# Patient Record
Sex: Female | Born: 1975 | Race: Black or African American | Hispanic: No | Marital: Single | Smoking: Former smoker
Health system: Southern US, Community
[De-identification: ages and names within clinical notes are randomized; demographics above are authoritative.]

## PROBLEM LIST (undated history)

## (undated) DIAGNOSIS — N200 Calculus of kidney: Secondary | ICD-10-CM

## (undated) HISTORY — PX: KIDNEY STONE SURGERY: SHX686

## (undated) HISTORY — PX: TUBAL LIGATION: SHX77

---

## 1997-11-24 ENCOUNTER — Inpatient Hospital Stay (HOSPITAL_COMMUNITY): Admission: AD | Admit: 1997-11-24 | Discharge: 1997-11-24 | Payer: Self-pay | Admitting: *Deleted

## 1998-02-16 ENCOUNTER — Inpatient Hospital Stay (HOSPITAL_COMMUNITY): Admission: EM | Admit: 1998-02-16 | Discharge: 1998-02-16 | Payer: Self-pay | Admitting: Obstetrics

## 1998-05-11 ENCOUNTER — Inpatient Hospital Stay (HOSPITAL_COMMUNITY): Admission: AD | Admit: 1998-05-11 | Discharge: 1998-05-11 | Payer: Self-pay | Admitting: *Deleted

## 1998-08-03 ENCOUNTER — Inpatient Hospital Stay (HOSPITAL_COMMUNITY): Admission: AD | Admit: 1998-08-03 | Discharge: 1998-08-03 | Payer: Self-pay | Admitting: *Deleted

## 1998-10-26 ENCOUNTER — Inpatient Hospital Stay (HOSPITAL_COMMUNITY): Admission: AD | Admit: 1998-10-26 | Discharge: 1998-10-26 | Payer: Self-pay | Admitting: *Deleted

## 1999-01-18 ENCOUNTER — Inpatient Hospital Stay (HOSPITAL_COMMUNITY): Admission: AD | Admit: 1999-01-18 | Discharge: 1999-01-18 | Payer: Self-pay | Admitting: *Deleted

## 1999-04-26 ENCOUNTER — Inpatient Hospital Stay (HOSPITAL_COMMUNITY): Admission: AD | Admit: 1999-04-26 | Discharge: 1999-04-26 | Payer: Self-pay | Admitting: *Deleted

## 1999-07-19 ENCOUNTER — Inpatient Hospital Stay (HOSPITAL_COMMUNITY): Admission: AD | Admit: 1999-07-19 | Discharge: 1999-07-19 | Payer: Self-pay | Admitting: *Deleted

## 1999-10-11 ENCOUNTER — Inpatient Hospital Stay (HOSPITAL_COMMUNITY): Admission: AD | Admit: 1999-10-11 | Discharge: 1999-10-11 | Payer: Self-pay | Admitting: *Deleted

## 2000-01-03 ENCOUNTER — Inpatient Hospital Stay (HOSPITAL_COMMUNITY): Admission: EM | Admit: 2000-01-03 | Discharge: 2000-01-03 | Payer: Self-pay | Admitting: *Deleted

## 2000-04-15 ENCOUNTER — Emergency Department (HOSPITAL_COMMUNITY): Admission: EM | Admit: 2000-04-15 | Discharge: 2000-04-15 | Payer: Self-pay | Admitting: Emergency Medicine

## 2000-09-06 ENCOUNTER — Encounter: Payer: Self-pay | Admitting: Emergency Medicine

## 2000-09-06 ENCOUNTER — Emergency Department (HOSPITAL_COMMUNITY): Admission: EM | Admit: 2000-09-06 | Discharge: 2000-09-06 | Payer: Self-pay | Admitting: Emergency Medicine

## 2000-11-22 ENCOUNTER — Emergency Department (HOSPITAL_COMMUNITY): Admission: EM | Admit: 2000-11-22 | Discharge: 2000-11-23 | Payer: Self-pay | Admitting: *Deleted

## 2000-11-22 ENCOUNTER — Encounter: Payer: Self-pay | Admitting: Emergency Medicine

## 2001-06-22 ENCOUNTER — Other Ambulatory Visit: Admission: RE | Admit: 2001-06-22 | Discharge: 2001-06-22 | Payer: Self-pay | Admitting: Family Medicine

## 2002-02-02 ENCOUNTER — Emergency Department (HOSPITAL_COMMUNITY): Admission: EM | Admit: 2002-02-02 | Discharge: 2002-02-02 | Payer: Self-pay | Admitting: Emergency Medicine

## 2002-02-02 ENCOUNTER — Encounter: Payer: Self-pay | Admitting: Emergency Medicine

## 2002-03-01 ENCOUNTER — Other Ambulatory Visit: Admission: RE | Admit: 2002-03-01 | Discharge: 2002-03-01 | Payer: Self-pay | Admitting: Obstetrics and Gynecology

## 2002-04-25 ENCOUNTER — Encounter: Payer: Self-pay | Admitting: Obstetrics and Gynecology

## 2002-04-25 ENCOUNTER — Ambulatory Visit (HOSPITAL_COMMUNITY): Admission: RE | Admit: 2002-04-25 | Discharge: 2002-04-25 | Payer: Self-pay | Admitting: Obstetrics and Gynecology

## 2002-06-06 ENCOUNTER — Encounter: Payer: Self-pay | Admitting: Obstetrics and Gynecology

## 2002-06-06 ENCOUNTER — Ambulatory Visit (HOSPITAL_COMMUNITY): Admission: RE | Admit: 2002-06-06 | Discharge: 2002-06-06 | Payer: Self-pay | Admitting: Obstetrics and Gynecology

## 2002-09-10 ENCOUNTER — Encounter (INDEPENDENT_AMBULATORY_CARE_PROVIDER_SITE_OTHER): Payer: Self-pay | Admitting: Specialist

## 2002-09-10 ENCOUNTER — Inpatient Hospital Stay (HOSPITAL_COMMUNITY): Admission: AD | Admit: 2002-09-10 | Discharge: 2002-09-12 | Payer: Self-pay | Admitting: Obstetrics and Gynecology

## 2005-09-10 ENCOUNTER — Emergency Department (HOSPITAL_COMMUNITY): Admission: EM | Admit: 2005-09-10 | Discharge: 2005-09-10 | Payer: Self-pay | Admitting: Emergency Medicine

## 2006-07-08 ENCOUNTER — Emergency Department (HOSPITAL_COMMUNITY): Admission: EM | Admit: 2006-07-08 | Discharge: 2006-07-08 | Payer: Self-pay | Admitting: Emergency Medicine

## 2006-07-22 ENCOUNTER — Emergency Department (HOSPITAL_COMMUNITY): Admission: EM | Admit: 2006-07-22 | Discharge: 2006-07-22 | Payer: Self-pay | Admitting: Emergency Medicine

## 2007-01-04 ENCOUNTER — Emergency Department (HOSPITAL_COMMUNITY): Admission: EM | Admit: 2007-01-04 | Discharge: 2007-01-04 | Payer: Self-pay | Admitting: Emergency Medicine

## 2007-01-07 ENCOUNTER — Emergency Department (HOSPITAL_COMMUNITY): Admission: EM | Admit: 2007-01-07 | Discharge: 2007-01-08 | Payer: Self-pay | Admitting: Emergency Medicine

## 2007-01-11 ENCOUNTER — Emergency Department (HOSPITAL_COMMUNITY): Admission: EM | Admit: 2007-01-11 | Discharge: 2007-01-11 | Payer: Self-pay | Admitting: Emergency Medicine

## 2007-01-14 ENCOUNTER — Emergency Department (HOSPITAL_COMMUNITY): Admission: EM | Admit: 2007-01-14 | Discharge: 2007-01-15 | Payer: Self-pay | Admitting: Emergency Medicine

## 2007-01-15 ENCOUNTER — Inpatient Hospital Stay (HOSPITAL_COMMUNITY): Admission: AD | Admit: 2007-01-15 | Discharge: 2007-01-16 | Payer: Self-pay | Admitting: Obstetrics & Gynecology

## 2007-09-15 ENCOUNTER — Emergency Department (HOSPITAL_COMMUNITY): Admission: EM | Admit: 2007-09-15 | Discharge: 2007-09-15 | Payer: Self-pay | Admitting: Emergency Medicine

## 2007-10-12 ENCOUNTER — Emergency Department (HOSPITAL_COMMUNITY): Admission: EM | Admit: 2007-10-12 | Discharge: 2007-10-12 | Payer: Self-pay | Admitting: Emergency Medicine

## 2007-10-15 ENCOUNTER — Ambulatory Visit (HOSPITAL_COMMUNITY): Admission: RE | Admit: 2007-10-15 | Discharge: 2007-10-15 | Payer: Self-pay | Admitting: Emergency Medicine

## 2007-10-16 ENCOUNTER — Ambulatory Visit (HOSPITAL_COMMUNITY): Admission: RE | Admit: 2007-10-16 | Discharge: 2007-10-17 | Payer: Self-pay | Admitting: Gastroenterology

## 2007-10-16 ENCOUNTER — Ambulatory Visit: Payer: Self-pay | Admitting: Gastroenterology

## 2007-10-16 LAB — CONVERTED CEMR LAB
Albumin: 3.2 g/dL — ABNORMAL LOW (ref 3.5–5.2)
CO2: 23 meq/L (ref 19–32)
Creatinine, Ser: 0.5 mg/dL (ref 0.4–1.2)
GFR calc Af Amer: 186 mL/min
MCHC: 35.1 g/dL (ref 30.0–36.0)
MCV: 84.6 fL (ref 78.0–100.0)
Platelets: 358 10*3/uL (ref 150–400)
Potassium: 4.1 meq/L (ref 3.5–5.1)
RDW: 15.7 % — ABNORMAL HIGH (ref 11.5–14.6)
Sodium: 136 meq/L (ref 135–145)
Total Bilirubin: 11.4 mg/dL — ABNORMAL HIGH (ref 0.3–1.2)
Total Protein: 8.1 g/dL (ref 6.0–8.3)
WBC: 9 10*3/uL (ref 4.5–10.5)

## 2007-10-18 ENCOUNTER — Inpatient Hospital Stay (HOSPITAL_COMMUNITY): Admission: EM | Admit: 2007-10-18 | Discharge: 2007-10-21 | Payer: Self-pay | Admitting: Emergency Medicine

## 2007-10-24 ENCOUNTER — Ambulatory Visit: Payer: Self-pay | Admitting: Internal Medicine

## 2007-11-09 ENCOUNTER — Ambulatory Visit: Payer: Self-pay | Admitting: Gastroenterology

## 2007-11-09 LAB — CONVERTED CEMR LAB
Albumin: 3.2 g/dL — ABNORMAL LOW (ref 3.5–5.2)
Alkaline Phosphatase: 84 units/L (ref 39–117)
Basophils Absolute: 0 10*3/uL (ref 0.0–0.1)
Basophils Relative: 0 % (ref 0.0–1.0)
Eosinophils Absolute: 0.2 10*3/uL (ref 0.0–0.6)
HCT: 36.9 % (ref 36.0–46.0)
MCHC: 33.3 g/dL (ref 30.0–36.0)
MCV: 85.5 fL (ref 78.0–100.0)
Monocytes Absolute: 0.6 10*3/uL (ref 0.2–0.7)
Monocytes Relative: 7.2 % (ref 3.0–11.0)
Neutro Abs: 5.2 10*3/uL (ref 1.4–7.7)
Platelets: 319 10*3/uL (ref 150–400)
WBC: 7.9 10*3/uL (ref 4.5–10.5)

## 2007-11-22 ENCOUNTER — Ambulatory Visit (HOSPITAL_COMMUNITY): Admission: RE | Admit: 2007-11-22 | Discharge: 2007-11-22 | Payer: Self-pay | Admitting: Gastroenterology

## 2007-12-06 ENCOUNTER — Emergency Department (HOSPITAL_COMMUNITY): Admission: EM | Admit: 2007-12-06 | Discharge: 2007-12-07 | Payer: Self-pay | Admitting: Emergency Medicine

## 2008-04-27 ENCOUNTER — Inpatient Hospital Stay (HOSPITAL_COMMUNITY): Admission: EM | Admit: 2008-04-27 | Discharge: 2008-04-28 | Payer: Self-pay | Admitting: Emergency Medicine

## 2008-04-27 ENCOUNTER — Encounter: Payer: Self-pay | Admitting: Internal Medicine

## 2008-04-29 ENCOUNTER — Ambulatory Visit: Payer: Self-pay | Admitting: Internal Medicine

## 2008-04-30 ENCOUNTER — Emergency Department (HOSPITAL_COMMUNITY): Admission: EM | Admit: 2008-04-30 | Discharge: 2008-05-01 | Payer: Self-pay | Admitting: Emergency Medicine

## 2008-05-01 ENCOUNTER — Telehealth (INDEPENDENT_AMBULATORY_CARE_PROVIDER_SITE_OTHER): Payer: Self-pay | Admitting: *Deleted

## 2008-05-06 ENCOUNTER — Telehealth (INDEPENDENT_AMBULATORY_CARE_PROVIDER_SITE_OTHER): Payer: Self-pay | Admitting: *Deleted

## 2008-05-06 ENCOUNTER — Telehealth: Payer: Self-pay | Admitting: Gastroenterology

## 2008-05-07 ENCOUNTER — Emergency Department (HOSPITAL_COMMUNITY): Admission: EM | Admit: 2008-05-07 | Discharge: 2008-05-07 | Payer: Self-pay | Admitting: Emergency Medicine

## 2008-05-16 ENCOUNTER — Encounter: Payer: Self-pay | Admitting: Gastroenterology

## 2008-05-28 ENCOUNTER — Ambulatory Visit: Payer: Self-pay | Admitting: Gastroenterology

## 2008-05-28 DIAGNOSIS — K8050 Calculus of bile duct without cholangitis or cholecystitis without obstruction: Secondary | ICD-10-CM | POA: Insufficient documentation

## 2008-06-02 ENCOUNTER — Telehealth: Payer: Self-pay | Admitting: Gastroenterology

## 2008-06-17 ENCOUNTER — Encounter: Payer: Self-pay | Admitting: Gastroenterology

## 2008-10-01 ENCOUNTER — Emergency Department (HOSPITAL_COMMUNITY): Admission: EM | Admit: 2008-10-01 | Discharge: 2008-10-01 | Payer: Self-pay | Admitting: Emergency Medicine

## 2008-10-15 ENCOUNTER — Ambulatory Visit: Payer: Self-pay | Admitting: Gastroenterology

## 2008-10-16 ENCOUNTER — Encounter: Payer: Self-pay | Admitting: Gastroenterology

## 2008-10-16 LAB — CONVERTED CEMR LAB
ALT: 14 units/L (ref 0–35)
AST: 17 units/L (ref 0–37)
BUN: 9 mg/dL (ref 6–23)
Basophils Absolute: 0 10*3/uL (ref 0.0–0.1)
Basophils Relative: 0.5 % (ref 0.0–3.0)
Calcium: 9.1 mg/dL (ref 8.4–10.5)
Chloride: 105 meq/L (ref 96–112)
Creatinine, Ser: 0.7 mg/dL (ref 0.4–1.2)
Eosinophils Absolute: 0.3 10*3/uL (ref 0.0–0.7)
Eosinophils Relative: 3.1 % (ref 0.0–5.0)
GFR calc Af Amer: 126 mL/min
HCT: 36.7 % (ref 36.0–46.0)
Hemoglobin: 12.1 g/dL (ref 12.0–15.0)
MCHC: 32.9 g/dL (ref 30.0–36.0)
MCV: 85 fL (ref 78.0–100.0)
Neutro Abs: 5.5 10*3/uL (ref 1.4–7.7)
Neutrophils Relative %: 57 % (ref 43.0–77.0)
RBC: 4.31 M/uL (ref 3.87–5.11)
Total Bilirubin: 0.7 mg/dL (ref 0.3–1.2)
WBC: 9.5 10*3/uL (ref 4.5–10.5)

## 2008-11-04 ENCOUNTER — Telehealth: Payer: Self-pay | Admitting: Gastroenterology

## 2009-01-07 ENCOUNTER — Encounter: Payer: Self-pay | Admitting: Gastroenterology

## 2009-02-16 ENCOUNTER — Encounter (INDEPENDENT_AMBULATORY_CARE_PROVIDER_SITE_OTHER): Payer: Self-pay | Admitting: Surgery

## 2009-02-16 ENCOUNTER — Inpatient Hospital Stay (HOSPITAL_COMMUNITY): Admission: RE | Admit: 2009-02-16 | Discharge: 2009-02-20 | Payer: Self-pay | Admitting: Surgery

## 2009-02-24 ENCOUNTER — Encounter: Payer: Self-pay | Admitting: Gastroenterology

## 2009-02-25 ENCOUNTER — Ambulatory Visit: Payer: Self-pay | Admitting: Obstetrics and Gynecology

## 2009-02-25 ENCOUNTER — Inpatient Hospital Stay (HOSPITAL_COMMUNITY): Admission: AD | Admit: 2009-02-25 | Discharge: 2009-02-25 | Payer: Self-pay | Admitting: Obstetrics and Gynecology

## 2009-07-31 ENCOUNTER — Emergency Department (HOSPITAL_COMMUNITY): Admission: EM | Admit: 2009-07-31 | Discharge: 2009-07-31 | Payer: Self-pay | Admitting: Emergency Medicine

## 2009-09-11 ENCOUNTER — Emergency Department (HOSPITAL_COMMUNITY): Admission: EM | Admit: 2009-09-11 | Discharge: 2009-09-11 | Payer: Self-pay | Admitting: Emergency Medicine

## 2009-09-17 ENCOUNTER — Emergency Department (HOSPITAL_COMMUNITY): Admission: EM | Admit: 2009-09-17 | Discharge: 2009-09-17 | Payer: Self-pay | Admitting: Emergency Medicine

## 2009-12-31 IMAGING — RF DG CHOLANGIOGRAM OPERATIVE
1 series · 4 of 4 positions shown · non-contrast
Comparison: CT 10/01/2008

CLINICAL DATA: Cholelithiasis.

INTRAOPERATIVE CHOLANGIOGRAM
TECHNIQUE: Multiple fluoroscopic spot radiographs were obtained
during intraoperative cholangiogram and are submitted for
interpretation post-operatively.
Fluoroscopy Time: 12 seconds

[Series 1: run · 4 of 31 frames shown]
[frame 5/31]
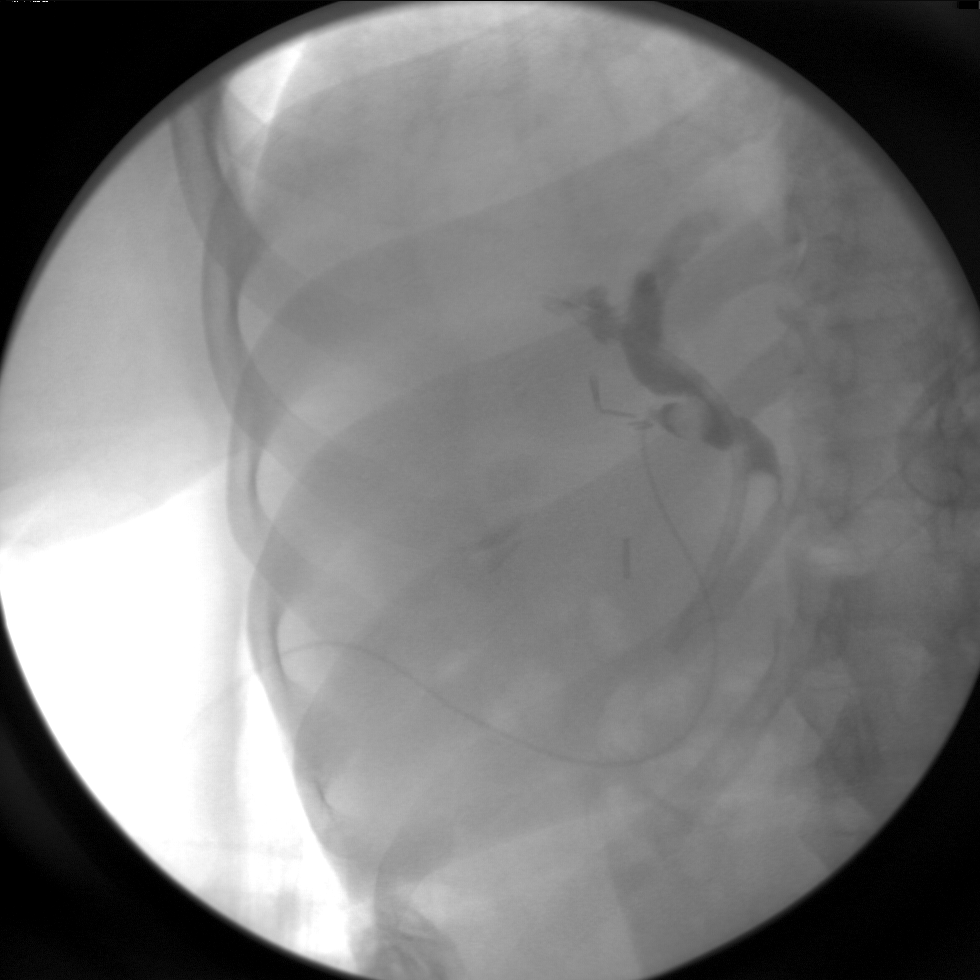
[frame 16/31]
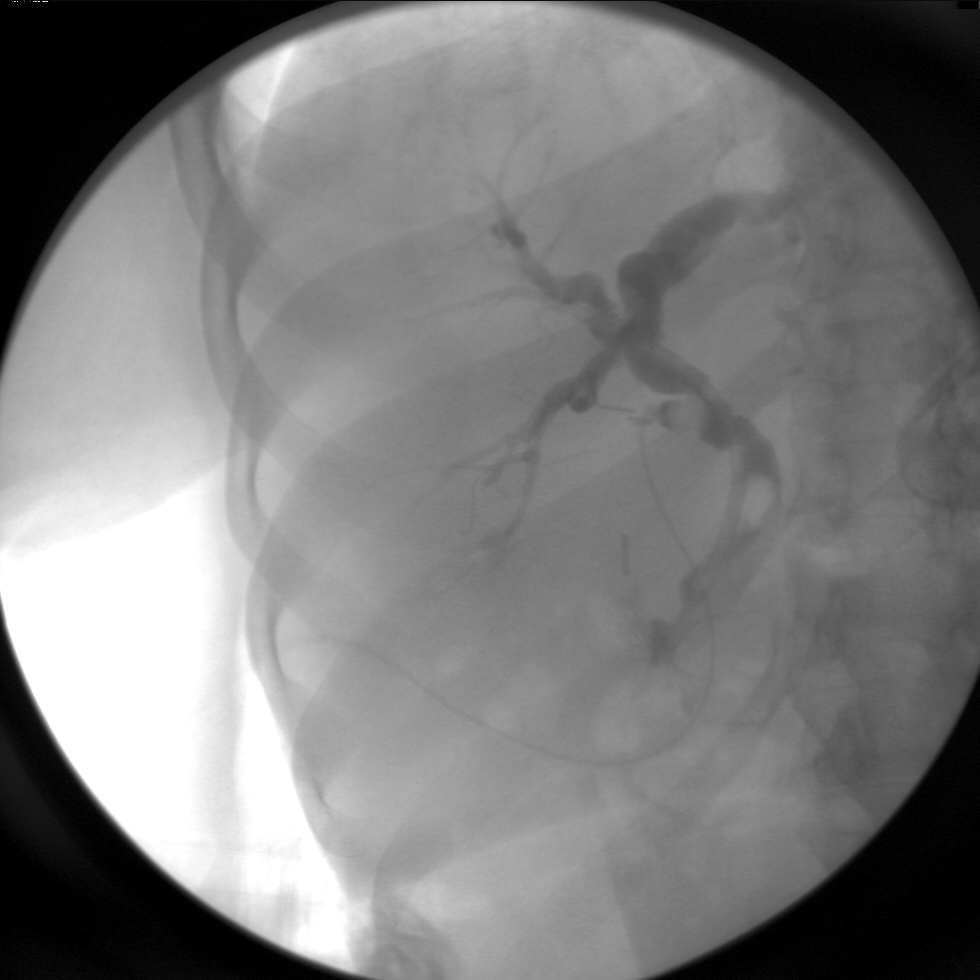
[frame 27/31]
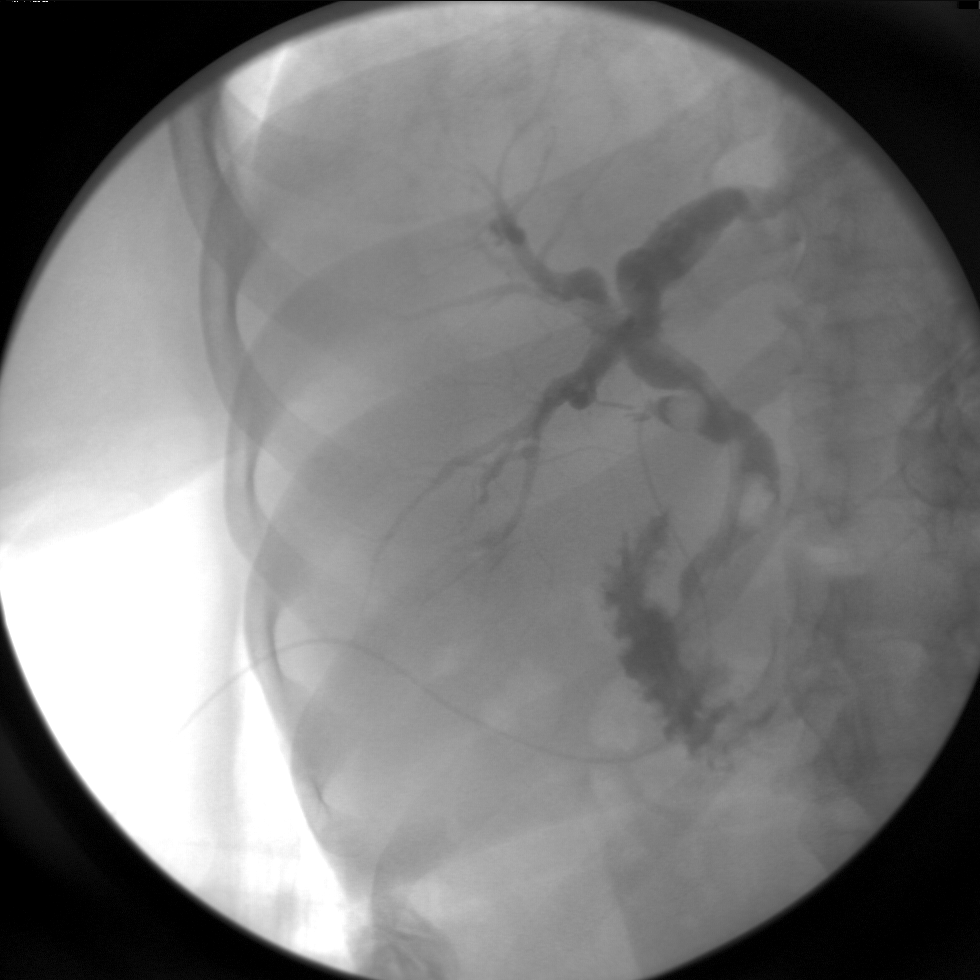
[frame 31/31]
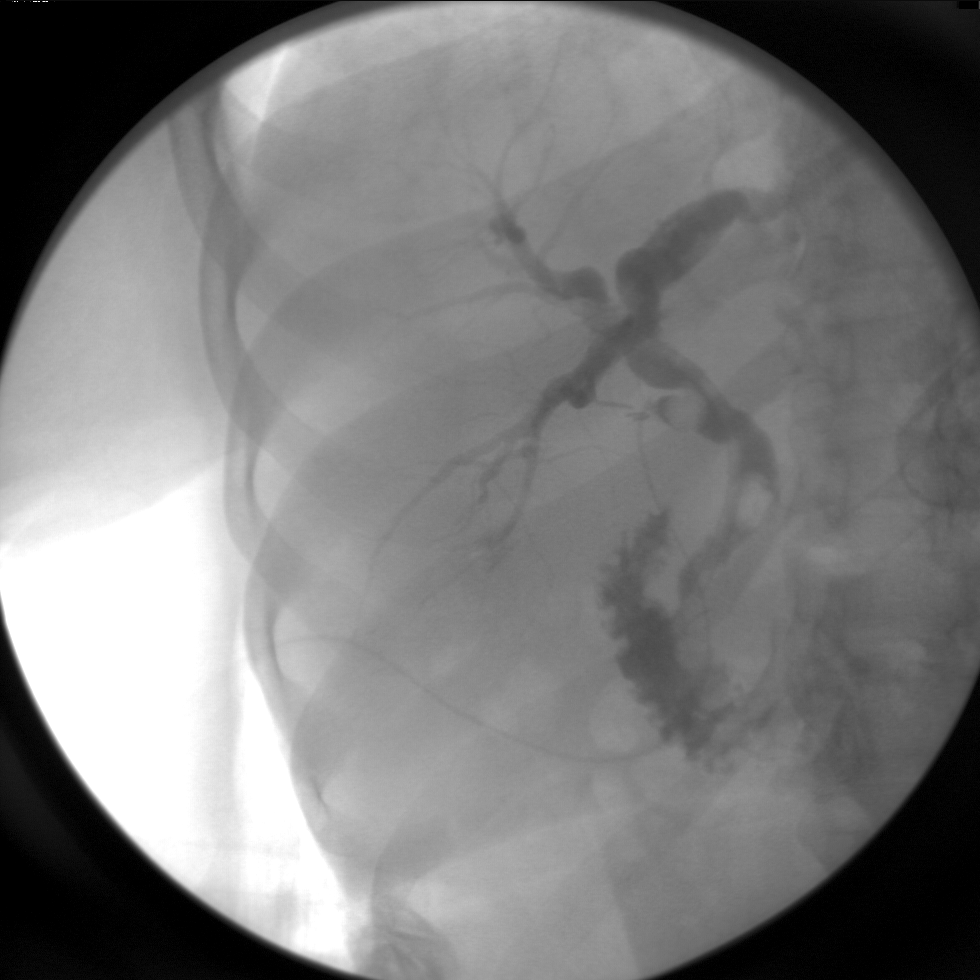

[4 of 4 positions shown; findings below may reference images not displayed]

FINDINGS: Contrast injection of the cystic duct.  There is a
filling defect in the cystic duct and also a filling defect in the
common bile duct.  Filling defects may be due to air bubbles
however calculi cannot be excluded.  Contrast passes into the
duodenum.  There is a biliary stent in position.  It extends from
the intrahepatic ducts down to the duodenum.
IMPRESSION: Intraluminal filling defects in the cystic duct and common bile
duct.  Ductal system is patent.  Biliary stent in satisfactory
position.

## 2011-01-03 LAB — POCT PREGNANCY, URINE: Preg Test, Ur: NEGATIVE

## 2011-01-03 LAB — URINE MICROSCOPIC-ADD ON

## 2011-01-03 LAB — URINALYSIS, ROUTINE W REFLEX MICROSCOPIC
Glucose, UA: NEGATIVE mg/dL
Protein, ur: NEGATIVE mg/dL
Specific Gravity, Urine: 1.024 (ref 1.005–1.030)
Urobilinogen, UA: 1 mg/dL (ref 0.0–1.0)

## 2011-01-11 LAB — CBC
HCT: 29.3 % — ABNORMAL LOW (ref 36.0–46.0)
Hemoglobin: 9.7 g/dL — ABNORMAL LOW (ref 12.0–15.0)
MCV: 86.8 fL (ref 78.0–100.0)
Platelets: 330 10*3/uL (ref 150–400)
RBC: 3.09 MIL/uL — ABNORMAL LOW (ref 3.87–5.11)
RBC: 3.36 MIL/uL — ABNORMAL LOW (ref 3.87–5.11)
WBC: 17 10*3/uL — ABNORMAL HIGH (ref 4.0–10.5)
WBC: 9.7 10*3/uL (ref 4.0–10.5)

## 2011-01-11 LAB — HEMOGLOBIN AND HEMATOCRIT, BLOOD
HCT: 36.3 % (ref 36.0–46.0)
Hemoglobin: 12.2 g/dL (ref 12.0–15.0)

## 2011-01-11 LAB — BILIRUBIN, TOTAL: Total Bilirubin: 0.7 mg/dL (ref 0.3–1.2)

## 2011-01-11 LAB — HEPATIC FUNCTION PANEL
ALT: 184 U/L — ABNORMAL HIGH (ref 0–35)
AST: 154 U/L — ABNORMAL HIGH (ref 0–37)
Indirect Bilirubin: 0.8 mg/dL (ref 0.3–0.9)
Total Protein: 4.9 g/dL — ABNORMAL LOW (ref 6.0–8.3)

## 2011-01-11 LAB — CREATININE, SERUM
Creatinine, Ser: 0.56 mg/dL (ref 0.4–1.2)
GFR calc Af Amer: >60 mL/min (ref >60)
GFR calc non Af Amer: >60 mL/min (ref >60)

## 2011-01-11 LAB — PREGNANCY, URINE: Preg Test, Ur: NEGATIVE

## 2011-01-11 LAB — WET PREP, GENITAL: Yeast Wet Prep HPF POC: NONE SEEN

## 2011-01-11 LAB — POTASSIUM: Potassium: 3.3 mEq/L — ABNORMAL LOW (ref 3.5–5.1)

## 2011-02-15 NOTE — Discharge Summary (Signed)
[name], [name]              ACCOUNT NO.:  [id number]   MEDICAL RECORD NO.:  [id number]          PATIENT TYPE:  INP   LOCATION:  1529                         FACILITY:  [facility]   PHYSICIAN:  Wilhemina Bonito. Marina Goodell, MD      DATE OF BIRTH:  [birth year]   DATE OF ADMISSION:  10/18/2007  DATE OF DISCHARGE:  10/21/2007                               DISCHARGE SUMMARY   ADMITTING DIAGNOSES:  68. A 35 year old female admitted with right upper quadrant pain.  Rule      out post ERCP pancreatitis versus biliary colic.  2. Rule out urinary tract infection.  3. Status post ERCP and sphincterotomy October 16, 2007 with finding      of a smooth stricture of common bile duct and choledocholithiasis      now status post biliary stenting.   DISCHARGE DIAGNOSES:  17. A 35 year old female stable and improved with choledocholithiasis      now status post stent replacement after migration.  2. Mild post ERCP pancreatitis, resolving.  3. Cholelithiasis.  4. 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   PROCEDURES:  ERCP and stent replacement per Dr. Yancey Flemings.   BRIEF HISTORY:  [name] is a 35 year old female seen by Dr. Christella Hartigan on  October 16, 2007 and undergoing ERCP that same day.  She was found to  have choledocholithiasis and a smooth common bile duct stricture making  removal of the common bile duct stones not feasible.  She did undergo a  sphincterotomy, and plastic biliary stent was placed.  She was watched  in the hospital overnight.  Her labs were repeated.  She had also  undergone CT scan of the abdomen and pelvis to rule out neoplasm, though  it was suspected that her stricture was benign.  The CT showed mild  residual biliary dilation following the stent placement.  Stent appeared  to be in good position.  There was new pneumobilia, no evidence of  pancreatic mass or pancreatic ductal dilation.  There was some mild  retroperitoneal edema inferior to the  pancreatic body and tail  suggesting mild pancreatitis.  She was having some nausea, but,  otherwise, felt better the following morning and was allowed discharge  to home.  On the evening of her discharge she developed increased right  upper quadrant pain which progressed and was constant.  She rated it  9/10.  At the time of readmission she has been nauseated, but has not  vomited.  Continues to have itching.  Did complain of some chills, but  no documented fever.  She came back to the emergency room and at this  time his admitted for pain control and further diagnostic evaluation.   LABORATORY DATA:  On January 15 shows WBC 9.9, hemoglobin 12.4,  hematocrit 36, MCV 83.8, platelets 418.  On January 17 WBC of 9.3,  hemoglobin 10.4, hematocrit 29.9, platelets 339.  Sodium 133, potassium  3.6, BUN 7, creatinine 0.64.  Albumin 2.9, total bilirubin on admission  was 11.8.  Alk phos 414, ALT 296, AST 179.  Follow-up  on January 18  showed a total bilirubin of 5.5, alk phos 311, ALT 195, and AST 75.  Lipase on admission was 81.  Follow-up on the 16th:  Lipase was 27.  UA  on admission showed 11-20 rbc's, 11-20 wbc's.  Urine culture was not  done.   X-RAY STUDIES:  Abdominal films on January 16 showed the biliary stent  which appeared to have flipped side to side.  It also showed the  proximal portion of the stent previously at T10 now at T12.   Abdominal ultrasound on January 16 showed suspect distal migration of  biliary stent beyond common bile duct stone with recurrent obstructive  choledocholithiasis, tiny amount of fluid around the gallbladder, distal  common bile duct obscured by overlying bowel gas.   HOSPITAL COURSE:  The patient was admitted to the service of Dr. Rob Bunting.  She was initially placed on sips of clear liquids, IV Dilaudid,  IV Zofran, IV Cipro, and baseline labs were obtained.  She did have some  evidence of mild pancreatitis.  The following morning she was  feeling  better without any significant pain.  Her bilirubin was 11.8, and  abdominal films were obtained to rule out stent migration.  The KUB was  abnormal, and showed that the stent had probably flipped and migrated  distally.  Dr. Marina Goodell then planned ERCP with stent change later that same  day.  This was accomplished on the 16th, and the old stent was pulled  and a new 8.32F/ 7 cm stent was replaced above the stricture.  She was  continued on antibiotics overnight.  Her bilirubin gradually decreased  thereafter.  She had no significant abdominal pain, remained afebrile,  and by the 18th was felt stable for discharge to home.  Plan was for  outpatient surgical consultation for cholecystectomy and to follow up  with Dr. Christella Hartigan on February 18 to schedule repeat procedure and stent  removal.   MEDICATIONS ON DISCHARGE:  1. Vicodin 5/500 one over six hours as needed for pain.  2. Cipro 500 b.i.d. x5 more days.  3. Phenergan 25 mg q.6 h. as needed for nausea.      Amy Esterwood, PA-C      John N. Marina Goodell, MD  Electronically Signed    AE/MEDQ  D:  11/05/2007  T:  11/05/2007  Job:  [id number]   cc:   Rachael Fee, MD  [address]  [place], [place] [address]

## 2011-02-15 NOTE — Assessment & Plan Note (Signed)
[facility]                         GASTROENTEROLOGY OFFICE NOTE   NAME:[name], [name]                       MRN:          [id number]  DATE:10/16/2007                            DOB:          [birth year]    REFERRING PHYSICIAN:  Devoria Albe, M.D.   The patient was referred by Devoria Albe, M.D. from the [facility] emergency room.   REASON FOR CONSULTATION:  Dr. Lynelle Doctor asked me to evaluate the patient in  consultation regarding jaundice.   HISTORY OF PRESENT ILLNESS:  The patient is a very pleasant 35 year old  woman who has had intermittent epigastric discomfort for several months.  About a week ago, she noticed yellowing of her eyes, darkening of her  urine, and itchy skin.  She presented to the emergency room again for  this complaint and found to have a total bilirubin of 9.2.  This was  four days ago.  Direct bilirubin 5.5, alkaline phosphatase 250, ALT 504,  AST 229.  A CBC was done that was normal.  Coags were also done and they  were normal.  She had an abdominal ultrasound performed yesterday  showing several gallstones in her gallbladder with a 5 mm gallstone in  her common bile duct with a common duct dilatation up to 10 mm.  She  also had some intrahepatic duct dilation.   She has had no fevers or chills, but she has been waking up sweaty the  past several days.  She has been nauseous especially in the afternoons.   REVIEW OF SYSTEMS:  Notable for stable weights, otherwise essentially  normal except for noted on nursing intake sheet.   PAST MEDICAL HISTORY:  None.   CURRENT MEDICATIONS:  None.   ALLERGIES:  No known drug allergies.   SOCIAL HISTORY:  Single.  She has two children.  Nonsmoker and  nondrinker.   FAMILY HISTORY:  Father with diabetes, otherwise noncontributory.   PHYSICAL EXAMINATION:  VITAL SIGNS:  5 feet 2 inches, 191 pounds, blood  pressure 120/76, pulse 60.  CONSTITUTIONAL:  Generally  well-appearing.  NEUROLOGY:  Alert and oriented x3.  HEENT:  Eyes; icteric sclerae.  Mouth; oropharynx moist with no lesions.  NECK:  Supple with no lymphadenopathy.  HEART:  Regular rate and rhythm.  LUNGS:  Clear to auscultation bilaterally.  ABDOMEN:  Soft, nontender, and nondistended with normal bowel sounds.  EXTREMITIES:  No lower extremity edema.  SKIN:  No rashes or lesions on visible extremities.   ASSESSMENT:  A 35 year old woman with jaundice, choledocholithiasis,  cholelithiasis.   I explained to the patient that she has a gallstone obstructing her  common bile duct.  This will need to be taken care of by ERCP.  She  understands there are risks to her procedure such as perforation,  pancreatitis, infection, bleeding.  She understands these risks and  wishes to proceed.  I will arrange for her to have an ERCP done later  this afternoon.  Hopefully it will be an uneventful case and she will be  able to go home afterward.  I would then arrange for her  to have an  outpatient evaluation to consider elective cholecystectomy.     Rachael Fee, MD  Electronically Signed    DPJ/MedQ  DD: 10/16/2007  DT: 10/16/2007  Job #: [id number]

## 2011-02-15 NOTE — H&P (Signed)
[name], [name]              ACCOUNT NO.:  [id number]   MEDICAL RECORD NO.:  [id number]          PATIENT TYPE:  INP   LOCATION:  1529                         FACILITY:  [facility]   PHYSICIAN:  Rachael Fee, MD   DATE OF BIRTH:  29-Aug-1976   DATE OF ADMISSION:  10/18/2007  DATE OF DISCHARGE:                              HISTORY & PHYSICAL   CHIEF COMPLAINT:  Pain in the right upper quadrant with nausea.   HISTORY OF PRESENT ILLNESS:  [name] is a 35 year old African  American female who has had at least a couple of months of epigastric  pain.  She was seen at the emergency room in December at which time labs  were normal.  She was treated for what was presumed to be reflux disease  with either a PPI or H2 blocker.  She did not have any imaging test  then.  January 9, she came back because of intermittent epigastric pain  which was not severe, and she also noticed that the whites of her eyes  were beginning to become yellow, her urine was getting dark, and her  skin was itchy.  Dr. Lynelle Doctor saw her in the emergency room that day, and  her total bilirubin was 9.2 with alkaline phosphatase 250, AST 229 and  ALT 504.  Urine pregnancy test negative.  Urinalysis negative.  CBC was  within normal limits.  Appetite is acute.  Profile was also negative.   She was referred to and saw Dr. Rob Bunting.   DICTATION ENDED HERE.      Jennye Moccasin, PA-C      Rachael Fee, MD     SG/MEDQ  D:  10/18/2007  T:  10/19/2007  Job:  [id number]

## 2011-02-15 NOTE — Op Note (Signed)
[name], [name]              ACCOUNT NO.:  [id number]   MEDICAL RECORD NO.:  [id number]          PATIENT TYPE:  OIB   LOCATION:  0098                         FACILITY:  [facility]   PHYSICIAN:  Ardeth Sportsman, MD     DATE OF BIRTH:  [birth year]   DATE OF PROCEDURE:  02/16/2009  DATE OF DISCHARGE:                               OPERATIVE REPORT   PRIMARY CARE PHYSICIAN:  Mirna Mires, M.D.   GASTROENTEROLOGIST:  Wendall Papa, M.D.  Danny Lawless, M.D. at [facility], Department  of Gastroenterology.   SURGEON:  Ardeth Sportsman, M.D.   ASSISTANT:  Almond Lint, MD.   PREOPERATIVE DIAGNOSES:  1. Chronic cholecystolithiasis, status post failed endoscopic      retrograde cholangiopancreatography x3.  2. Cholecystolithiasis.  3. Chronic cholecystitis  .   PROCEDURES PERFORMED:  1. Laparoscopic lysis of adhesions done x90 minutes.  2. Laparoscopic converted to open cholecystectomy.  3. Laparoscopic converted to open common bile duct exploration with      choledochoscopy and evacuation of chronic common bile duct stones.  4. Open hepaticojejunostomy with Roux-en-Y reconstruction.   ANESTHESIA:  1. General anesthesia.  2. Local aesthetic in a field block.   SPECIMENS:  1. Gallbladder.  2. Common bile duct stones.   DRAINS:  A 19-French Blake drain rests behind the right hepatic lobe and  then sweeps over the portal region over the hepaticojejunostomy and the  distal common bile duct/duodenal stump and common bile duct stump.   ESTIMATED BLOOD LOSS:  300 mL.   COMPLICATIONS:  Small mesentery tear with port placement initially, but  no evidence of any bowel injury.   INDICATIONS:  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  She had an  ERCP with some stones but incomplete removal. She had stent placement.  Dr. Christella Hartigan and Dr. Marina Goodell as well as Dr. Marilynne Drivers at [facility] have had to do repeat ERCP with stent changes.  Lithotripsies have been attempted to break them up, but they have been  unsuccessful.   The patient was sent to me for definitive removal of her common bile  duct stones through the biliary system.  The anatomy and physiology of  hepatobiliary and pancreatic function was discussed.  Pathophysiology of  choledocholithiasis and cholecystolithiasis with its risks for  cholangitis which she has already had and other risks were discussed.  Options were discussed and recommendations made for a laparoscopic  possible open cholecystectomy and common bile duct exploration with  removal of stones.  Risks, benefits, and alternatives were discussed.  Numerous questions were answered and she agreed to proceed.   OPERATIVE FINDINGS:  She had dense adhesions to the right upper quadrant  consistent with chronic cholecystitis with prior episodes of  cholangitis.  She had a very contracted gallbladder.  Her common bile  duct actually was closer to a narrow caliber.  She had 2 large common  bile duct stones, one at the cystic duct/common bile duct junction.   She had  another common bile duct stone in the distal aspect.  I was not  able to retrieve this by  choledochoscopy definitively with balloon or  basket.  The common bile duct was too shortened not to be able to spare  it, so we ended up having to do an open removal and hepaticojejunostomy.   DESCRIPTION OF PROCEDURE:  Informed consent was confirmed.  The patient  received IV cefoxitin and gentamicin given her ciprofloxacin allergy.  She had a Foley catheter sterilely placed.  She underwent anesthesia  without any difficulty.  She had sequential compression devices active  during the case.  She was positioned supine with both arms tucked.  Later in the case, her right arm was placed out.  Her abdomen was  prepped, and raped in a sterile fashion.   A surgical time-out confirmed our  plan.   A #5 mm port was placed in the right upper quadrant using optical entry  technique with the patient in steep Trendelenburg right side up.  Camera  inspection noted that I was getting close to but not within the  transverse colon.  Under direct visualization, a final port was placed  through the umbilicus.  On placing that, I did nick one of the mesentery  of the jejunum with  little bit of oozing of blood.  A 5-0 port was  placed in the right flank and a 5 converted to 10 mm port was placed in  the right anterior axillary line. Another  10 port was tunneled through  the falciform ligament and subxiphoid region.   Camera inspection noted a small nick in the mesentery of the jejunum.  Careful inspection noted that there was no more bleeding.  The bowel was  run around that region and re-inspected along the transverse colon and  meticulous inspection was done.  There was no evidence of any other  injury.   Attention was turned to the right upper quadrant.  Dense omental and  mesocolon adhesions could be seen on the right hepatic liver  edge.  These were carefully freed off using blunt as well as blunt dissection  hook cautery.  Over time, I was able to free enough of the epiploic  appendages and omentum to see a shrunken gallbladder fundus.  This was  grasped and elevated cephalad.  Further dissection was done to free the  mesorectal and mesocolon attachments to the anterior wall of the  gallbladder.  With that, the planes were very densely inflamed and thick  and cemented.  Using careful hook cautery and careful blunt as well as  occasional sharp dissection, I was able to free off the gallbladder from  its attachments to the liver bed in a dome down type fashion.  I felt  that was safe to do since the gallbladder was quite contracted.  I came  down to the infundibulum.  A good candidate for the posterior branch  cystic artery was controlled with one clip on the gallbladder side,  2  clips slightly proximal.  One of the clips fell off and later we were  able to locate that.  Further dissection was done and the hepatic artery  I could see rolling along right up against the gallbladder cystic duct  infundibulum and this was carefully seen, isolated and peeled away.  The  cystic duct was extremely short, only about 5 mm.   Further dissection was done to identify the portal triad in the anterior  common bile duct.  The common  bile duct was freed on its right lateral  wall all the way down to the duodenal edge.  Further dissection was done  to help free the left side.   A cholangiogram was attempted using a percutaneous placement of 5-French  cholangiocatheter through a puncture into the infundibulum, but I could  not advance it.  I ended up re-cutting at the cystic duct, advancing the  catheter and doing a cholangiogram.  A cholangiogram was run using  diluted radiopaque contrast & continuous fluoroscopy.  Two large filling  defects could be seen in the common bile duct.  The wound was at the  cystic duct/common bile duct junction.  Another one was distally more at  the distal common bile duct.  There was no evidence of any intrahepatic  stones.  This there was evidence of another centimeter intrahepatic  cystic duct but it was within the common bile duct itself.   Scissors were used to continue the partial cyst ductotomy  anterolaterally onto the common bile duct and opened up the remaining  cystic duct until I came to the common bile duct.  With that, we were  able to easily evacuate a stone at the cystic common bile duct juncture.  It probably about 9 mm in size and very spherical.   A multiple insert guide (MIG) was used through the 10 mL port in the  right subcostal region and a 4-French bougie balloon was passed through  it down into the distal common bile duct.  I could not get it to advance  very far.  I had to remove the [facility] plastic internal stent  to get  the choledochoscope to pass down more distally.  Numerous attempts were  tried to pull distal CBD stone(s) out.  We did feel like we pulled a  little bit of something back but not for certain.  We ended switching  over to the choledochoscope with irrigation.  The choledochoscope could  be seen advancing into the common bile duct and a few centimeters  distally I came across a very large common bile duct stone that was  densely adhered to the wall.  I tried to break it up with the scope.  I  tried to use a 4-French bougie balloon and a 5-French bougie balloon as  well as a wire basket.  Initially, at this spot, I was getting the stone  to move up a little bit, but upon re-inspection it was hard to tell.  I  extend the choledochotomy a little more distally but was coming close to  the duodenal edge since it was a short common bile duct inferior to the  common bile duct/cystic duct junction.  Further attempts were made and I  felt like I could get a little bit of it to move but not enough.   Re-inspection of the common bile duct noted the posterior wall was  intact, but the sidewalls of the ductotomy were looking rather ischemic.  I could not get the stone out and I did not want to extend the ductotomy  further laparoscopically.  So, we converted to an open incision.  A  right subcostal incision was carried from the subxiphoid port over to  the right upper quadrant port.  A Bookwalter was placed.  With exposure  of this, I was able to get completely around the common bile duct.  Looking on closer inspection, I felt like the common bile duct was not  healthy enough to close so we  decided to convert to a  hepaticojejunostomy.  We ended up transecting the common bile duct just  about 1 cm inferior to the cystic/common bile duct junction.  We opened  up the ductotomy on the anterior wall of the distal common bile duct.  I  was able to get some right angle forceps in there and actually  could  start feeling the tip of the stone.  The stone was very large.  I tried  to move it with my hand to milk it back up and could not.  I could see  it with the choledochoscope but not manipulate it.  Eventually, I was  able to get a longer right angle to actually grasp and break up the  stone.  Between milking my finger and grabbing with the right-angle  scope, I was able to milk out a lot of  large fragments distal common  bile duct stone.  I got most fragments out and with passing a 5-French  Foley balloon with about 4 passes, I eventually got several smaller 2-5  mm fragments brought up.  With that, we got the choledochoscope in the  distal common bile duct and could see some ulceration in the distal  common bile duct but not a major stricture evident as the  choledochoscope easily passed into the duodenum. There were a few  remaining stone fragments and we were able to flush out and sweep it  completely clean..   The choledochoscope was turned cephalad and I was able to cannulate the  proximal common bile duct, the proximal common hepatic duct and up into  the right and left intrahepatic chains which also looked pristine and  clear with no evidence of stricture or other abnormality, although on  the proximal right hepatic duct, there was a little bit ulceration,  probably from the proximal end of the prior ERCP plastic stent.  The  distal common bile duct stump was trimmed to healthier tissues and  oversewn using 3-0 Prolene stitch with tails obviously left externally  to good result.   We did a hepaticojejunostomy for biliary reconstruction.  We found the  ligament of Treitz.  We chose a site 30 cm distal to the limb of Treitz  and transected with a 55 GIA stapler and took the mesentery in an array-  like fashion.  With that, I was able to get good mobility of the distal  end.  A window was made in the transverse colon to the right of the  middle colic vessels and the distal  end of the proximal jejunal  transection was brought in retrocolic.  A loop easily laid on top of the  duodenal bulb right up to the proximal common hepatic duct. A side-to-  side jejuno-jejunostomy stapled transection was made by running the  small bowel about 30 cm distal to and have for the common channel. The  common defect was closed using 3-0 PDS in a running [place] fashion to  good result.  The common mesenteric defect between the jejunojejunostomy  was closed using interrupted 3-0 silk stitches.  The Peterson's defect  toward the jejunum went retrocolic through the transverse colon.  The  mesocolon was tacked to the jejunum going through its Peterson defect  around circumferentially using interrupted 3-0 silk figure-of-eight  stitches as well.   Hepaticojejunostomy was reconstructed after opening up the jejunum about  2 cm proximal to the transection staple line on the antimesenteric side.  Of note, it the jejunum was  laying such that the distal end swept off  laterally to have a candy-cane with the tail facing towards the right  flank.  The distal hepatic duct was trimmed back to healthy bleeding  tissue for good circumferential repair.  We did take a little patch of  the of the cystic duct lateral wall as well that was nice and viable.  A  hepaticojejunostomy was performed using interrupted 4-0 PDS stitches.  The back wall was sewn using interrupted stitches times 7 for core  stitches and 5 posterior wall stitches with the tails obviously  externally buried.  The anterior row was sewed with 6 more stitches  anteriorly to get a good hepaticojejunostomy.  The anastomosis looked  healthy and viable with no evidence of any bile leak.  A larger stitch  was placed from the jejunal end of the hepaticojejunostomy to the liver  for extra buttressing.   The [place] drain was placed as noted before.  Copious irrigation was  done.  Tisseel was placed circumferentially around the   hepaticojejunostomy incision as well as on top of the duodenal stump  repair.  A nasogastric tube had been placed in I confirmed good  placement of the stomach.  Copious irrigation was done in the abdomen  with clear return after about 4 liters irrigation.  The subcostal  incision was closed in a two-layer fashion using #1 looped PDS.  A #10 mm port in the right subcostal flank had rolled up above the costal edge  so I could not close that more aggressively.  The skin was closed using  4-0 Monocryl stitch.  A sterile dressing was applied.  The patient was  extubated and sent to the recovery room in stable condition.  Discussed  postop findings with the patient's family.  She had numerous calls  during our discussion and when we could talk to her daughter the  daughter was not available  right then. I re-explained it again and stressed the high points of it.  She expressed appreciation.  I had to discuss postoperative care and  postoperative recovery goals for discharge and long term follow-up as  well and she expressed appreciation.      Ardeth Sportsman, MD  Electronically Signed     SCG/MEDQ  D:  02/16/2009  T:  02/16/2009  Job:  [id number]   cc:   Annia Friendly. Loleta Chance, MD  Fax: [phone]   Rachael Fee, MD  [address]  [place], [place] [address]   Danny Lawless  Fax: [phone]

## 2011-02-15 NOTE — Op Note (Signed)
[name], [name]              ACCOUNT NO.:  [id number]   MEDICAL RECORD NO.:  [id number]          PATIENT TYPE:  INP   LOCATION:  1534                         FACILITY:  [facility]   PHYSICIAN:  Wilhemina Bonito. Marina Goodell, MD      DATE OF BIRTH:  1976/03/01   DATE OF PROCEDURE:  04/27/2008  DATE OF DISCHARGE:                               OPERATIVE REPORT   PROCEDURE:  Endoscopic retrograde cholangiography with biliary stent  exchange.   INDICATIONS:  Acute cholangitis.   HISTORY:  This is a 35 year old female who presented earlier this year  with abdominal pain and jaundice.  She was found to have common duct  stones, gallbladder stones, and a distal duct stricture.  Stones were  unable to be extracted on several occasions.  Thus, she has been  temporized with biliary stents.  Her last procedure was performed at the  [facility] [place].  The patient has been lost to follow-up since March.  She had presented to the emergency room in the early hours of this day  with abdominal pain, low-grade fever and jaundice.  She was found to  have elevated liver tests.  Biliary endoprosthesis noted on ultrasound.  She is now for ERCP with possible stent exchange and stone extraction.  The nature of the procedure as well as the risks, benefits and  alternatives were reviewed.  She understood and agreed to proceed.  The  patient was continued on IV Unasyn.   PHYSICAL EXAMINATION:  A somewhat sick-feeling female in no acute  distress.  She is alert and oriented.  VITAL SIGNS:  Stable.  LUNGS:  Clear.  HEART:  Regular.  ABDOMEN:  Soft with minimal tenderness the right upper quadrant.  Good  bowel sounds heard.   PROCEDURE:  After informed consent was obtained, the patient was sedated  with 60 mcg of fentanyl and 6 mg of Versed IV.  She was also given  Phenergan 25 mg IV.  Glucagon 0.5 mg was given as a duodenal relaxant.  Cetacaine spray was used to numb the oropharynx.  Subsequently the  Pentax  side-viewing scope was passed blindly into the esophagus.  The  proximal stomach was not examined.  The distal stomach and duodenal bulb  were normal.  The postbulbar duodenum was remarkable for a previously-  placed plastic biliary endoprosthesis protruding into the duodenum.  This was removed with an endoscopic snare.  A scout radiograph of the  abdomen with the initial stent in position was taken followed by a  radiograph with the stent removed.  Subsequently the common duct was  deeply and selectively cannulated through a prior small biliary  sphincterotomy.  No contrast was injected into the biliary system  initially intentionally.  The system was decompressed.  Only bile was  extracted.  No purulent material.  Subsequent injection of contrast into  the biliary tree revealed no significant dilation.  There was an 8-10-mm  distal filling defect.  This was consistent with stone.  The duct was  tapered narrowly below.  The cystic duct was patent.  No significant  attempts were made to extract  the stone.  A new 8.5-French in diameter,  7 cm in length, plastic biliary endoprosthesis was placed in good  position with the proximal portion of the stent below the bifurcation  but above the stone and the distal portion of the stent going just into  the duodenum at the level of the flap.  Excellent drainage noted and  accompanying radiographs obtained.   IMPRESSION:  1. 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  2. Persistent choledocholithiasis.  3. Status post biliary stent exchange.  4. Cholelithiasis.   RECOMMENDATIONS:  1. Continue antibiotics.  2. Discuss long-term treatment plan with Dr. Christella Hartigan and the GI team in      am at weekly meeting.  We may need additional information or input      from Dr. Marilynne Drivers, at [facility].      Wilhemina Bonito. Marina Goodell, MD  Electronically Signed     JNP/MEDQ  D:  04/27/2008  T:  04/27/2008  Job:  [id number]   cc:   Rachael Fee,  MD  [address]  [place], [place] [address]

## 2011-02-15 NOTE — Op Note (Signed)
[name], [name]              ACCOUNT NO.:  [id number]   MEDICAL RECORD NO.:  [id number]          PATIENT TYPE:  INP   LOCATION:  1529                         FACILITY:  [facility]   PHYSICIAN:  Wilhemina Bonito. Marina Goodell, MD      DATE OF BIRTH:  09-25-1976   DATE OF PROCEDURE:  10/19/2007  DATE OF DISCHARGE:                               OPERATIVE REPORT   PROCEDURE:  Endoscopic retrograde cholangiopancreatography with biliary  stent removal and replacement.   INDICATIONS:  Choledocholithiasis and biliary stricture.   HISTORY:  This is a 35 year old female with symptomatic cholelithiasis  and choledocholithiasis.  She initially underwent ERCP approximately  three days ago.  She was found to have an 8-mm common duct stone with  biliary ductal dilation.  The stone was above a benign-appearing, though  tight, distal stricture in the intrapancreatic portion of the common  bile duct.  The stone could not be extracted due to the restrictions  posed by the stricture.  Thus, a 7-French 5-cm biliary endoprosthesis  was placed.  The patient presented yesterday with recurrent abdominal  pain and persisting jaundice.  Her liver tests were worse from the day  previous.  Films of the abdomen today showed that the previously placed  biliary stent had migrated distally out of position, likely below the  level of the stricture.  Ultrasound today revealed minimal fluid around  the gallbladder, though  no gallbladder wall thickening.  The  cholelithiasis noted.  She is now for ERCP with stent exchange.  The  nature of the procedure as well as the risks, benefits, and alternatives  were reviewed with the patient in detail.  She understood and agreed to  proceed.   PHYSICAL EXAMINATION:  Well-appearing female in no acute distress.  She  is alert and oriented.  She is jaundiced.  Sclerae are icteric.  Abdomen  revealed some mild right upper quadrant tenderness.  LUNGS:  Were clear.  HEART:  Regular.  ABDOMEN:   Was otherwise benign.   PROCEDURE:  After informed consent was obtained ,the patient was sedated  with 150 mcg of fentanyl and 17 mg of Versed over the course of the  procedure.  Glucagon 0.5 mg IV was given as a duodenal relaxant.  Benadryl 50 mg IV was given as well to enhance sedation.  Ciprofloxacin  IV was continued preprocedurally.  The Pentax side-viewing endoscope was  then passed blindly into the esophagus.  The stomach was unremarkable.  The duodenal bulb was unremarkable.  Postbulbar duodenum was  unremarkable.  The major ampulla revealed the majority of the previously  placed biliary stent protruding into the duodenum.  This was retrieved  with an endoscopic snare.  Scout radiograph of the abdomen with the  endoscope in position revealed contrast from previous CT scan.  Additional injection of contrast yielded a small pancreatogram.  Subsequently, the common bile duct was deeply and selectively  cannulated.  Filling of contrast in the bile duct again revealed smooth  several centimeters in length distal stricture in the intrapancreatic  portion of the common bile duct.  Above this, the biliary  tree was  modestly dilated.  A 7- to 8-mm filling defect consistent with the  previously noted stone was seen in the region of bifurcation near the  left hepatic duct.  Initial attempts were made to pass a 10-French 7 cm  in length biliary endoprosthesis.  However, the system would not pass  through the scope due to overwhelming resistance raising a question as  to whether there was a defect in the lining of the inner portion of the  scope. Unfortunately, the backup scope was out for repair.  Thus, it was  elected to remove the 10 [place] system and use an 8.5-French in diameter  stent.  This passed without difficulty.  Thus, an 8.5-French 7 cm in  length plastic biliary endoprosthesis was placed into the bile duct.  Proximal portion of the stent was in good position, well above the   stricture but below the bifurcation.  The intraduodenal portion of the  stent was at the level of the distal flap.  Drainage was excellent.   IMPRESSION:  1. Migrated biliary stent status post removal.  2. Persistent distal bile duct stricture and proximal common duct      stone status post 8.5-French 7-cm stent placement.  3. Cholelithiasis.   RECOMMENDATIONS:  1. Continue antibiotics.  2. Observe postprocedurally both clinically and biochemically.  3. Long term management decisions for strictured duct and gallstones      to be determined.      Wilhemina Bonito. Marina Goodell, MD  Electronically Signed     JNP/MEDQ  D:  10/19/2007  T:  10/20/2007  Job:  [id number]   cc:   Rachael Fee, MD  [address]  [place], [place] [address]   [place] Daphine Deutscher, MD  [address]  [place]  [place] [address]

## 2011-02-15 NOTE — H&P (Signed)
NAMESTEPHAN, DRAUGHN              ACCOUNT NO.:  [id number]   MEDICAL RECORD NO.:  [id number]          PATIENT TYPE:  INP   LOCATION:  1534                         FACILITY:  [facility]   PHYSICIAN:  Wilhemina Bonito. Marina Goodell, MD      DATE OF BIRTH:  [birth year]   DATE OF ADMISSION:  04/27/2008  DATE OF DISCHARGE:                              HISTORY & PHYSICAL   PROBLEM:  Abdominal pain, fever, nausea and vomiting.   HISTORY:  Kalifa is a pleasant, generally healthy 35 year old African  American female with history of a distal common bile duct stricture.  She is known to Dr. Christella Hartigan from [facility].  She had undergone ERCP with  sphincterotomy in January 2009.  At that time with inability to remove a  distal common bile duct stone and therefore biliary stent was placed.  She came back to the hospital shortly thereafter with jaundice and  abdominal pain.  It was found that the initial stent had migrated.  This  was removed and a new stent was placed by Dr. Marina Goodell.  She then was  scheduled for EUS, which was performed per Dr. Christella Hartigan February 2009.  He  was found to have an 8 mm round stone abutting the common bile duct  stent, otherwise negative exam with no evidence for malignancy,  adenopathy, etc.  She had ERCP the same day with Dr. Christella Hartigan.  At ERCP  appeared that the stricture had resolved were multiple attempts made to  remove the stone, which were unsuccessful and a 10-cm 7-French stent was  placed.  She was subsequently referred to the [facility], Dr.  Fredric Mare, and underwent ERCP there.  At the time of admission, the patient  was unclear what exactly was done at the time of that ERCP.  However,  after reviewing her office records it shows that she had ERCP on March  31 which again was unsuccessful at stone removal.  She had several  attempts including a mechanical lithotripsy.  The stone was not able to  be extracted.  It was partially fragmented and then a dilator was placed  and a  7-French 9-cm straight biliary stent was placed into the bile  duct.  The plan at that time was for followup and 3 months with repeat  ERCP and lithotripsy.  She was also apparently placed on Urso 300 mg  twice daily.   At this time, the patient presents to the emergency room with complaints  of recurrent abdominal pain onset on the morning of admission associated  with chills.  She states the pain was intense, fairly constant and  associated with nausea, vomiting, fever at home.  She did not have any  radiation of her pain across her abdomen or into her back.  She was seen  and evaluated and admitted for supportive management with labs showing  elevated LFTs consistent with cholangitis.   CURRENT MEDICATIONS:  None regularly.   ALLERGIES:  No known drug allergies.   LABORATORY STUDIES:  On April 27, 2008, WBCs 12.1, hemoglobin 13.1,  hematocrit of 40.3, platelets 290, BUN 5, creatinine 0.66, total  bilirubin 3.5, alk phos 145, SGOT 1256, SGPT 1043, amylase 126, lipase  12, beta HCG was negative.  Abdominal ultrasound through the emergency  room shows cholelithiasis and a common bile duct of 8 mm containing a  common bile duct stent.  There was no definite ductal dilation noted.   FAMILY HISTORY:  Positive for diabetes on the paternal side of family,  negative for GI disease.   SOCIAL HISTORY:  The patient is single.  She currently lives with her  mother.  She does have 2 children.  She is currently unemployed.  No  tobacco and no EtOH.   REVIEW OF SYSTEMS:  GI:  As outlined above.  CONSTITUTIONAL:  Positive  for fever and chills today.  GU:  Urine has been darker this morning.  RESPIRATORY:  No cough, shortness of breath.  CARDIOVASCULAR:  Negative  for chest pain or anginal symptoms.  MUSCULOSKELETAL:  Negative.  NEURO/PSYCH:  Negative.  All other review of systems negative.   PHYSICAL EXAM:  Well-developed young African American female in no acute  distress.  Temperature is  100.5.  Blood pressure 126/85.  Pulse 102.  Sat 96 on  room air.  HEENT:  Pertinent for early scleral icterus.  NECK:  Supple.  There is no JVD.  CARDIOVASCULAR:  Regular rate and rhythm with S1 and S2.  No murmur, rub  or gallop.  PULMONARY:  Clear to A and P.  ABDOMEN:  Soft.  Bowel sounds are active.  She is mildly tender in the  epigastric region.  There is no guarding or rebound.  No palpable mass  or hepatosplenomegaly.  EXTREMITIES:  No clubbing, cyanosis or edema.  NEURO:  The patient is alert and oriented and exam is grossly nonfocal.   IMPRESSION:  6. A 35 year old [place] American female with history of the distal      common bile duct stricture felt to be inflammatory secondary to      choledocholithiasis now status post several endoscopic retrograde      cholangiopancreatographs with attempts at stone extraction and      currently with indwelling biliary stent who presents with acute      abdominal pain, nausea, fever and elevated liver function tests all      consistent with cholangitis likely secondary to stent occlusion.  2. Retained common bile duct stone.  3. Cholelithiasis.   PLAN:  The patient is admitted to the service Dr. Yancey Flemings for  intravenous fluid hydration, intravenous analgesics.  She will be  covered with intravenous antibiotics.  We will schedule for endoscopic  retrograde cholangiopancreatography with stent removal and exchange  today and to review her records from [facility] regarding other plans for a  management.      Amy Esterwood, PA-C      John N. Marina Goodell, MD  Electronically Signed    AE/MEDQ  D:  04/28/2008  T:  04/28/2008  Job:  [id number]   cc:   Christella Hartigan, Dr.   Fredric Mare, Dr.  [facility]

## 2011-02-15 NOTE — Discharge Summary (Signed)
Amanda [name], [name]              ACCOUNT NO.:  [id number]   MEDICAL RECORD NO.:  [id number]          PATIENT TYPE:  OIB   LOCATION:  1521                         FACILITY:  [facility]   PHYSICIAN:  Jennye Moccasin, PA-C    DATE OF BIRTH:  01/03/1976   DATE OF ADMISSION:  10/16/2007  DATE OF DISCHARGE:  10/17/2007                               DISCHARGE SUMMARY   ADMITTING DIAGNOSES:  1. Jaundice and abnormal liver chemistries.  2. Gallstones on ultrasound.  Gallstones in common bile duct and      dilated common bile duct seen on recent ultrasound.  3. Childbirth x2.   DISCHARGE DIAGNOSIS:  1. Choledocholithiasis.  ERCP performed on October 16, 2007.  Dr.      Christella [name] unable to pull the stone through a smooth 2-3 cm mid to      distal common bile duct stricture.  Therefore, biliary      sphincterotomy performed and a 7-French 5 cm plastic biliary stent      placed across the stent.  CT scan confirms the stricture to be      benign in appearance.  2. Jaundice improving within 24 hours of the procedure.   CONSULTATIONS:  Dr. Luretha [name] from general surgery.   PROCEDURE:  ERCP with sphincterotomy and placement of stent to the  common bile duct on October 16, 2007.   BRIEF HISTORY:  [name] is a pleasant 35 year old African-American  woman who has been having several weeks worth of intermittent epigastric  discomfort.  She had been seen twice in the emergency room regarding  these symptoms; the first time was on September 15, 2007 when her LFTs  and pancreatic enzymes were normal.  She did not have any imaging  studies at that time, as her labs were so benign.  She was treated as a  reflux case and given Pepcid and Phenergan for use.  She also was given  phone numbers to make contact and establish care with [facility].  On  October 11, 2006, the patient returned with recurrent pain.  This time,  her total bilirubin was 9.2 with direct portion at 5.5.  Alkaline  phosphatase was 250,  ALT 504 and AST 229.  A CBC was normal, coags were  normal.  An abdominal ultrasound showed several gallstones; a 5 mm  gallstone in her common bile duct and common bile duct dilated to about  10 mm.  There was also some associated intrahepatic duct dilatation.  She was referred and saw Dr. Rob [name] in the office on October 16, 2007.  That same day, he set her up for ERCP.   The patient underwent the ERCP uneventfully; however, because of a  benign-appearing biliary stricture.  He was unable to remove the stone,  and thus performed a sphincterotomy and placed a plastic stent as a  temporizing measure.  She was admitted overnight for observation, as  well as for CT scan and to obtain an initial general surgery  consultation for evaluation for laparoscopic cholecystectomy.   LABORATORY DATA:  Hemoglobin 10.5, hematocrit 30.7, platelets 349,000.  White blood  cell count 12.  Sodium 135, potassium 3.9.  Chloride 109,  CO2 of 22, glucose 100, BUN 7, creatinine 0.6.  Bilirubin went from 11.4  to 6.4.  Alkaline phosphatase went from 335 to 270.  AST went from 175  to 130.  ALT went from 342 to 264.  Albumin 2.5.  Calcium 8.1.  Prior  labs of relevance include an acute viral hepatitis panel, hepatitis B  surface antigen, hepatitis B core antibody IgM, hepatitis A antibody IgM  and hepatitis C antibody, all negative.   CT SCAN OF THE ABDOMEN:  The preliminary reports as follows:  Biliary  stent well positioned.  Mild biliary dilatation with pneumobilia.  No  pancreatic mass or pancreatic duct dilatation seen.  Probable mild  pancreatitis.   HOSPITAL COURSE:  The patient spent an uneventful overnight following  the ERCP.  She was treated initially with IV ciprofloxacin.  In the  morning this was changed to oral Cipro.  That morning, she went from  sips of clears to a low-fat diet which she tolerated.  Her only  complaint was that of per pruritus.  This had been an issue for the last   several days, if not of the last few weeks.  This was treated with  p.r.n. Benadryl.   The patient was seen by Dr. Daphine [name] who agreed with Dr. Christella [name]'  management with antibiotics and the biliary stent.  Dr. Daphine [name] had  planned an elective laparoscopic cholecystectomy after ruling out the  neoplastic process.  He did qualify that if she had __________  she may  need a partial cholecystectomy with removal of stones.   The patient was doing well following the CT scan and the surgery  consult.  She had not required any pain medication during the day and  was discharged to home on the afternoon of October 17, 2007.  She had a  follow-up appointment with Dr. Christella [name] on November 09, 2007 at 10:30 a.m.,  and she was told to go to the [facility] on either November 07, 2007  or November 08, 2007 for lab work which included a CBC and a hepatic  function profile.   MEDICATIONS AT DISCHARGE:  1. Ciprofloxacin 500 mg one p.o. b.i.d. for 7 days.  2. Benadryl 25 mg one to two p.o. q.6h. as needed for itching.  She      can get this over-the-counter.  3. Tylenol as needed for pain.   She was advised that if she developed severe pain, non-resolution of her  tea-colored urine, fevers or chills that she should call Dr. Christella [name] and  not wait for the appointment.  She is to follow a low-fat diet.      Jennye Moccasin, PA-C     SG/MEDQ  D:  10/17/2007  T:  10/17/2007  Job:  [id number]   cc:   Rachael Fee, MD  [address]  [place], [place] [address]   [place] Amanda Deutscher, MD  [address], [address]  [place]  [place] [address]

## 2011-02-15 NOTE — Assessment & Plan Note (Signed)
[facility]                         GASTROENTEROLOGY OFFICE NOTE   NAME:[name], [name]                       MRN:          [id number]  DATE:11/09/2007                            DOB:          1976-01-28    GI PROBLEM LIST:  1. Cholelithiasis.  2. Choledocholithiasis with distal common bile duct stricture of      unclear etiology.  Status post ERCP October 16, 2007 by me.  Smooth      distal 2-3 cm common bile duct stricture.  Biliary sphincterotomy      performed.  Stone could not be pulled through the stricture.  A 7.5-      French x 5 cm long plastic biliary stent was placed.  Stent did not      hold her for very long.  She represented with pain and jaundice and      so an 8.5-French x 7 cm long stent was placed by Dr. Yancey Flemings.      The smooth, distal common bile duct stricture was again seen.   INTERVAL HISTORY:  I last saw [name] about a month ago when she was  hospitalized.  Since her second ERCP and placement of a longer, larger  diameter stent she has felt very well.  She has had no abdominal pains.  She has had no fevers or chills. She had repeat labs done just prior to  this visit to check her liver tests and they are not back yet.   CURRENT MEDICATIONS:  None.   PHYSICAL EXAMINATION:  Weight 190 pounds.  Blood pressure 118/76.  Pulse  76.  CONSTITUTIONAL:  Generally well appearing abdomen.  Soft, nontender,  nondistended.  Normal bowel sounds.   ASSESSMENT AND PLAN:  A 35 year old woman with cholelithiasis,  choledocholithiasis, distal common bile duct stricture.   Currently she has an 8.5-French, 7 cm long plastic biliary stent in  place.  She had a CT scan while she was hospitalized suggesting no mass  in her pancreas.  I will arrange for her to EUS with ERCP in 2 weeks'  time.  My goal for the EUS is to evaluate the distal CBD stricture again  from a different vantage point to again make sure she has no pancreatic  masses.   Following that I will proceed with ERCP, removal of her  existing stent, and hopefully remove the common duct stone.  If she  still has a very tight distal stricture at that point, I would likely  replace the stent and would have to consider referral to tertiary  biliary center such as [facility] with Dr. Noland Fordyce.     Rachael Fee, MD  Electronically Signed    DPJ/MedQ  DD: 11/09/2007  DT: 11/09/2007  Job #: [id number]

## 2011-02-15 NOTE — Discharge Summary (Signed)
NAMESUMIE, REMSEN              ACCOUNT NO.:  [id number]   MEDICAL RECORD NO.:  [id number]          PATIENT TYPE:  INP   LOCATION:  1534                         FACILITY:  [facility]   PHYSICIAN:  Hedwig Morton. Juanda Chance, MD     DATE OF BIRTH:  December 23, 1975   DATE OF ADMISSION:  04/26/2008  DATE OF DISCHARGE:  04/28/2008                               DISCHARGE SUMMARY   ADMITTING DIAGNOSES:  59. A 35 year old female with history of distal common bile duct      stricture felt to be inflammatory secondary to choledocholithiasis      now status post several ERCP and attempt at stone extraction which      have been unsuccessful with persistent retained common bile duct      stone and indwelling biliary stents now presenting with acute      abdominal pain, nausea, fever and elevated liver function studies      consistent with cholangitis, felt secondary to stent occlusion.  2. Cholelithiasis.   DISCHARGE DIAGNOSES:  1. 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  2. Retained distal common bile duct stone, refractory to multiple      attempts at extraction thus far.  3. Cholelithiasis.   CONSULTATIONS:  None.   PROCEDURES:  ERCP by Dr. Yancey Flemings April 27, 2008.   BRIEF HISTORY:  [name] is a pleasant 35 year old African American  female generally in good health, known previously to Dr. Christella Hartigan, who was  found to have a distal common bile duct stricture in January 2009.  Was  also found to have a distal common bile duct stone.  She had undergone  ERCP and sphincterotomy with inability to remove the stone and then had  a stent placed.  Shortly thereafter she returned with jaundice and pain  and was found to have of cholangitis from stent migration.  This one was  removed and another one was placed per Dr. Marina Goodell.  She subsequently had  endoscopic ultrasound per Dr. Christella Hartigan which did reveal an 8 mm round  stone abutting the common bile duct  stent.  Otherwise negative exam.  ERCP that same day with finding of resolved distal common bile duct  stricture.  However, with inability to extract the retained stone  despite multiple attempts.  A 10 French 7-cm stent was then placed.  She  was referred to Dr. Marilynne Drivers at [facility] for repeat ERCP.  This  was done on December 31, 2007 again with inability to remove the distal  common bile duct stone despite multiple attempts including lithotripsy.  It was felt that the stent stone was partially fragmented but was unable  to be removed.  Once again a common bile duct stent 7-French 9 cm was  placed.  The patient was to be on Urso b.i.d. and scheduled for a repeat  ERCP in 3 months.  She, at this time, seems to be unaware of any plans  for follow up with Dr. Marilynne Drivers and presented to the emergency room with  complaints of recurrent abdominal pain, chills,  and low grade fever at  home on the morning of admission.  She complained of fairly intense  epigastric pain rated 10/10 associated with nausea and vomiting, no  radiation to the back or across the abdomen.  Liver function studies  were elevated and she was admitted with probable cholangitis.   HOSPITAL COURSE:  The patient was admitted to the service of Dr. Yancey Flemings who was covering the hospital.  She was placed on IV unison, given  analgesics and antiemetics as needed.  She was scheduled for ERCP on  July 26, which was done per Dr. Marina Goodell with finding of an occluded stent.  The bile was aspirated and was not purulent.  She did have a filling  defect in the common bile duct consistent with the stone, and a distal  stricture.  A new stent was placed 8.5 French 7-cm with excellent  drainage.  The patient had a very benign hospital course, by the  following day was feeling well, had had no recurrent fever.  Initial  labs had shown a WBC of 12.1, hemoglobin 13.4, total bilirubin 3.5, alk  phos 145, SGOT 09/1955, SGPT 1043, amylase was  126, lipase was 12.  Followup labs on July 27 with a WBC of 7.8, potassium 3.3, total  bilirubin 2.4, alk phos 115, SGOT 167, SGPT 498.  Blood cultures are  negative thus far.  She is allowed discharge to home today with a course  of Cipro 500 b.i.d. x 7 days.  We will give her K-Dur 20 one p.o. b.i.d.  times 2 days and also restart her on Urso 300 b.i.d.  We are arranging  for followup ERCP with Dr. Marilynne Drivers, hopefully within the next couple of  weeks for repeat attempt at lithotripsy.  If this is unsuccessful, she  may need to be referred for open cholecystectomy and common bile duct  exploration.  The patient has also made a followup appointment with Dr.  Christella Hartigan on August 26 at 9:30 a.m.  She is to call for any problems in the  interim.      Amy [place], PA-C      Amanda M. Juanda Chance, MD  Electronically Signed    AE/MEDQ  D:  04/28/2008  T:  04/28/2008  Job:  [id number]   cc:   Danny Lawless  Fax: [phone]

## 2011-02-15 NOTE — Discharge Summary (Signed)
[name], [name]              ACCOUNT NO.:  [id number]   MEDICAL RECORD NO.:  [id number]          PATIENT TYPE:  INP   LOCATION:  1524                         FACILITY:  [facility]   PHYSICIAN:  Ardeth Sportsman, MD     DATE OF BIRTH:  18-Dec-1975   DATE OF ADMISSION:  02/16/2009  DATE OF DISCHARGE:  02/20/2009                               DISCHARGE SUMMARY   PRIMARY CARE PHYSICIAN:  Dr. Mirna Mires.   GASTROENTEROLOGIST:  Dr. Wendall Papa, [facility].   OTHER GASTROENTEROLOGIST:  Danny Lawless over [facility], Gastroenterology.   SURGEON:  Karie Soda.   PRINCIPAL DIAGNOSES:  1. Chronic choledocholithiasis status post failed endoscopic      retrograde cholangiopancreatography and stenting in past, failed      completion endoscopic retrograde cholangiopancreatography with      intermittent cholangitis and stents.  2. Chronic cholecystitis with cholecystolithiasis.   PROCEDURES PERFORMED:  1. Laparoscopic lysis of adhesions, laparoscopic converted to open      cholecystectomy, laparoscopic converted to open common bile duct      exploration with choledoscopy and evacuation of chronic common bile      duct stones and removal of internal stent.  2. Open hepaticojejunostomy with Roux-en-Y reconstruction on Feb 16, 2009.   HOSPITAL COURSE:  [name] is a pleasant 35 year old female who had  presented with jaundice and common bile duct stones but had ERCP with  partial evacuation and required a stenting. Re-attempt was done and was  not successful.  She was sent to [facility]  where repeat ERCP including lithotripsy was done and was not successful  and a stent was placed.  She had had episodes of recurrent stent  occlusion needing exchange of the stent secondary to cholangitis.  Surgical consultation was made and plan was made for removal of the  stent and common bile duct stones and cholecystectomy through a  laparoscopic and possible open approach.   Patient underwent surgery on Feb 16, 2009.  We were able to get most of  the work done laparoscopically but the final stone could not be removed  by laparoscopic means and so I had to convert to open to complete the  case given her common bile duct was rather fibrotic and beaten up with  significant evidence of ischemia after dissection so we converted to  hepaticojejunostomy for a bile duct reconstruction.   Postoperatively, she had an NG tube placed and IV fluid placed and  external drain was placed.  Her NG output was low.  I removed the NG  tube and drain and started a diet.  By postop day #4, she was walking  the hallways, she was tolerating a solid diet without any difficulty,  she had a bowel movement and flatus.  Her pain was adequately controlled  by her oral pills.   Given her remarkable short-term recovery so far, I thought it would be  reasonable for a discharge home since she is really wanting to get home  to be with her family.  Therefore, the patient will be sent home with the following  recommendations:  1. She is to return to clinic to see me in about 7 to 10 days for      probable removal of the external biliary drain.  2. She should call if she any fevers, chills, sweats, nausea,      vomiting, poor control of pain, drainage from her incisions, or      other concerns.  3. She should use Tylenol over the counter p.r.n. pain, oxycodone 5 mg      one to three p.o. every 4 hours p.r.n. pain.  4. Aleve one to two p.o. every 12 hours p.r.n. pain.  5. She should walk at least an hour a day being active.  When she can      tolerate 30 minutes of walking without any difficulty, she may do      moderate activity and then gradually advance unrestricted activity      over the next month or so.  6. She should keep her incisions clean and dry and shower every day      and use dressings p.r.n. only.   This plan was discussed in the  presence of a nurse, Darreld Mclean, as well as in  the presence of her family.  All are in agreement with this plan.      Ardeth Sportsman, MD  Electronically Signed     SCG/MEDQ  D:  02/20/2009  T:  02/20/2009  Job:  [id number]   cc:   Rachael Fee, MD  [address]  [place], [place] [address]   Annia Friendly. Loleta Chance, MD  Fax: [phone]   Danny Lawless  Fax: [phone]

## 2011-02-15 NOTE — H&P (Signed)
[name], [name]              ACCOUNT NO.:  [id number]   MEDICAL RECORD NO.:  [id number]          PATIENT TYPE:  INP   LOCATION:  1529                         FACILITY:  [facility]   PHYSICIAN:  Rachael Fee, MD   DATE OF BIRTH:  1975/12/25   DATE OF ADMISSION:  10/18/2007  DATE OF DISCHARGE:                              HISTORY & PHYSICAL   ADDENDUM:  This is a continuation of the admission history.   The patient went to see Dr. Christella Hartigan in the office on January 13, and he  did an ERCP that afternoon.  The ERCP study did confirm that she had a  common bile duct stone; however, she had a smooth stricture and he was  unable to remove this.  She did receive a sphincterotomy and had a  plastic stent placed.  She was kept in the hospital overnight.  Her labs  were rechecked and by the 13th her total bilirubin was 11.4, alkaline  phosphatase 335, AST 175 and ALT 342.  She was treated with Cipro, IV  fluids, initially just sips of clear liquids, but by the morning her  diet was advanced to a low-fat diet.  She had a CT scan of the abdomen.  This study showed mild residual biliary dilatation following biliary  stent placement.  Stent looked to be in good position and there was new  pneumobilia in the left lobe, which is to be expected following  sphincterotomy.  No evidence of pancreatic mass or pancreatic ductal  dilatation.  There was some mild retroperitoneal edema predominantly  inferior to the pancreatic body and tail, suggesting mild pancreatitis.  The patient did not have her lipase checked that day but she was doing  well.  Her pain, which was not significant on the day of the ERCP, was  not present the day following.  She was still complaining of the  pruritus and this was treated with Benadryl.  Her IV Cipro was changed  to oral Cipro.  She tolerated the low-fat diet and was sent home in  stable and improved condition.  She had an appointment to follow up in  early February with  Dr. Christella Hartigan.   On the evening of discharge, which was last night, she started to  develop right upper quadrant pain.  It persisted and is worse today.  She scores it at a 9/10 at its most intense.  She has nausea but has not  thrown up.  She still has the itching.  Her urine is less dark.  She  says she has had little bit of chills but she is not febrile.  She has  returned to the ER and some labs are still pending but the CBC is  normal.  The CMET and the lipase are still pending.  Urinalysis shows  some white cells and some red cells but is not strongly suggestive of an  active UTI.   ALLERGIES:  None.   MEDICATIONS:  Cipro 500 mg 1 twice daily.   SOCIAL HISTORY:  The patient is single.  She lives in [place].  She  has two  children.  She does not smoke or drink alcohol.   FAMILY HISTORY:  Significant for diabetes mellitus adult-onset type in  her father.   REVIEW OF SYSTEMS:  Less pruritus.  Urine is less dark.  No dysuria.  No  syncope.  No headaches.  No visual changes.  No chest pain, no cough.  Otherwise the review of systems is negative.   PAST MEDICAL HISTORY:  1. Choledocholithiasis.  This was associated with jaundice and she      underwent ERCP on January 13 with sphincterotomy and biliary stent      placement.  A common bile duct stone was not extractable secondary      to smooth common bile duct stricture.  2. Pancreatitis by CT scan on January 14.  3. Status post childbirth.   PHYSICAL EXAMINATION:  GENERAL:  The patient looks less icteric than she  did yesterday.  VITAL SIGNS:  Blood pressure is 140/88, temperature 98.4, room air  saturation 100%, respirations 20, pulse is 84.  HEENT:  Less icteric.  Extraocular movements are intact.  Conjunctiva is  pink.  Oropharynx is clear and moist.  NECK:  There are no masses, no JVD.  PULMONARY:  Clear to auscultation and percussion bilaterally.  CARDIOVASCULAR:  Regular rate and rhythm.  No murmurs, rubs or gallops.   GASTROINTESTINAL:  Abdomen is soft, nontender.  No guarding, no rebound.  The tenderness is predominantly in the right upper quadrant.  There is  minimal tenderness if any in the left upper quadrant.  No masses, no  hepatosplenomegaly.  NEUROLOGIC:  She is alert and oriented x3.  No tremors.  She moves all  four limbs easily and sits up in the bed easily.  PSYCHIATRIC:  She is appropriate.   LABORATORY:  Still pending at this time are the CMET and the lipase.  Hemoglobin is 12.4, hematocrit is 36, MCV 83.8.  Platelet count is 418.  White blood cell count 9.9.  Urinalysis is significant for a small  amount of leukocyte esterase and no nitrites, large amount of blood,  moderate bilirubin, cloudy appearance.  Urine microscopic shows a few  squamous epithelial cells and a few bacterial cells, 11-20 white blood  cells and 11-20 red blood cells.   IMPRESSION:  1. Right upper quadrant pain, rule out ongoing biliary colic, rule out      post ERCP pancreatitis versus biliary pancreatitis.  2. Rule out urinary tract infection.  Her urine does show moderately      significant amounts of white blood cells and red blood cells on      microscopic analysis.  3. Two days ago underwent ERCP with sphincterotomy.  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  A plastic biliary stent was      placed.  She has been seen by Dr. Adriana Mccallum for surgical      consideration but he plans surgery some time after she follows up      with Dr. Christella Hartigan in early February.   PLAN:  Admit the patient for supportive care, including IV fluids,  p.r.n. pain and nausea meds.  We still are awaiting the pending labs.  Apparently there is some problem with the lab equipment.  We will plan  to recheck a lipase and CMET tomorrow.      Jennye Moccasin, PA-C  Rachael Fee, MD  Electronically Signed    SG/MEDQ  D:  10/18/2007   T:  10/18/2007  Job:  [id number]

## 2011-02-18 NOTE — Discharge Summary (Signed)
[name], [name]                          ACCOUNT NO.:  [id number]   MEDICAL RECORD NO.:  [id number]                   PATIENT TYPE:  INP   LOCATION:  9106                                 FACILITY:  [facility]   PHYSICIAN:  Crist Fat. Rivard, M.D.              DATE OF BIRTH:  [birth year]   DATE OF ADMISSION:  09/10/2002  DATE OF DISCHARGE:  09/12/2002                                 DISCHARGE SUMMARY   ADMISSION DIAGNOSES:  1. Intrauterine pregnancy at 37 and 6/7 weeks.  2. Early labor.  3. Pregnancy induced hypertension without preeclampsia.  4. Multiparity desiring bilateral tubal ligation for sterilization.   DISCHARGE DIAGNOSES:  1. Intrauterine pregnancy at 37 and 6/7 weeks.  2. Early labor.  3. Pregnancy induced hypertension without preeclampsia.  4. Multiparity desiring bilateral tubal ligation for sterilization.  5. Status post normal spontaneous vaginal delivery.  6. Status post postpartum bilateral tubal ligation.  7. Bottle feeding.   PROCEDURE:  1. Normal spontaneous vaginal delivery of a viable female infant named Anthony     Wayne [place], Montez Hageman. who had Apgars of 8 and 9 and weighed 6 pounds 15     ounces.  Attended in delivery by Dr. Osborn Coho.  2. Postpartum bilateral tubal ligation for sterilization also on September 10, 2002 by Dr. Osborn Coho.  Please see operative note for details.   HOSPITAL COURSE:  The patient is a 35 year old black female gravida 3, para  1-0-1-1 at 18 and 6/7 weeks who presented complaining of uterine  contractions and was found to be in early labor.  She was admitted and  progressed in labor to normal spontaneous vaginal delivery of a viable female  infant named Anthony Wayne [place], Montez Hageman. who weighed 6 pounds 15 ounces and  had Apgars of 8 and 9 attended by Dr. Osborn Coho.  She also desired  bilateral tubal ligation and underwent the same on September 10, 2002 attended  by Dr. Osborn Coho also.  Postoperatively she has done well.   She is  ambulating, voiding, and eating without difficulty.  She is bottle feeding  her baby also without difficulty.  Her vital signs have been stable.  She  has been afebrile.  She has required Procardia to decrease her pregnancy  induced hypertension and her diastolic blood pressures are now in the 80s  and 90s.  She is ambulating, voiding, and eating without difficulty and is  deemed ready for discharge at this time.   DISCHARGE INSTRUCTIONS:  As per the [facility] handout.   DISCHARGE MEDICATIONS:  1. Motrin 600 mg p.o. q.6h. p.r.n. for pain.  2. Tylox one to two p.o. q.4-6h. p.r.n. for pain.  3. Prenatal vitamins daily.  4. Procardia XL 30 mg p.o. q.d. per Dr. Dois Davenport Rivard.   DISCHARGE LABORATORIES:  Her hemoglobin is 11.8, WBC count of 15.3, and  platelets 259,000.  DISCHARGE FOLLOWUP:  In one week by the home health nurse, three weeks at  [facility] for a blood pressure check, and six weeks for her  routine postpartum visit or p.r.n.     Concha Pyo. Duplantis, C.N.M.              Crist Fat Rivard, M.D.    SJD/MEDQ  D:  09/12/2002  T:  09/12/2002  Job:  [id number]

## 2011-02-18 NOTE — Op Note (Signed)
[name], [name]                          ACCOUNT NO.:  [id number]   MEDICAL RECORD NO.:  [id number]                   PATIENT TYPE:  INP   LOCATION:  9106                                 FACILITY:  [facility]   PHYSICIAN:  Osborn Coho, M.D.                DATE OF BIRTH:  1976-07-30   DATE OF PROCEDURE:  09/10/2002  DATE OF DISCHARGE:                                 OPERATIVE REPORT   PREOPERATIVE DIAGNOSES:  1. Elective permanent sterilization.  2. Postpartum day number 0 status post normal spontaneous vaginal delivery.   POSTOPERATIVE DIAGNOSES:  1. Elective permanent sterilization.  2. Postpartum day number 0 status post normal spontaneous vaginal delivery.   PROCEDURE:  Postpartum bilateral tubal ligation.   COMPLICATIONS:  None.   FLUIDS:  1500 cc.   ESTIMATED BLOOD LOSS:  Minimal, less than 5 cc.   ANESTHESIA:  Epidural.   FINDINGS:  Bilateral fallopian tubes with fimbriated ends.   PATHOLOGY:  Bilateral portions of fallopian tubes.   PROCEDURE:  The patient was taken to the operating room after the risks,  benefits, and alternatives discussed with patient.  The patient verbalized  understanding and consent.  Form reaffirmed as being signed and witnessed.  The patient was taken to the operating room and a surgical level on the  epidural was obtained per anesthesia.  The patient was in dorsal supine  position and an approximately 1 cm incision was made just below the  umbilicus.  The incision was carried down to the underlying layer of fascia  and the peritoneum identified and entered with the Metzenbaum scissors.  The  left fallopian tube was identified and carried out to its fimbriated end.  The fallopian tube was grasped in the mid portion with a Babcock and two 0  plain free ties used to ligate the tube.  Approximately 2 cm of the tube was  excised and sent off to pathology.  The remaining portion of the tube was  cauterized for hemostasis.  There was good  hemostasis and the fallopian tube  was returned to the intra-abdominal cavity.  Attention was then turned to  the right fallopian tube which in a similar manner was identified and  carried out to its fimbriated end and grasped in its mid portion with the  Babcock.  Two 0 plain free ties were used to ligate the tube and the tube  was excised for a length of approximately 3 cm and sent off to pathology.  The remaining portion of the tube was cauterized for hemostasis.  There was  good hemostasis and the tube was returned to the intra-abdominal cavity.  The fascia was  identified and repaired with 0 Vicryl in a running fashion.  3-0 Monocryl  subcuticular stitch was performed.  Steri-Strips were applied to the  incision.  The patient tolerated procedure well.  Sponge, lap, and needle  count was correct.  The patient  was returned to the recovery room in stable  condition.                                               Osborn Coho, M.D.    AR/MEDQ  D:  09/10/2002  T:  09/10/2002  Job:  [id number]

## 2011-02-18 NOTE — H&P (Signed)
[name], [name]                          ACCOUNT NO.:  [id number]   MEDICAL RECORD NO.:  [id number]                   PATIENT TYPE:  MAT   LOCATION:  MATC                                 FACILITY:  [facility]   PHYSICIAN:  Hal Morales, M.D.             DATE OF BIRTH:  [birth year]   DATE OF ADMISSION:  09/10/2002  DATE OF DISCHARGE:                                HISTORY & PHYSICAL   HISTORY OF PRESENT ILLNESS:  This is a 35 year old gravida 3, para 1, 0-1-1,  at 37-6/7 weeks who presents by ambulance with complaint of onset of  contractions one hour ago.  She denies leaking or bleeding.  She reports  that the pregnancy has been followed by the M.D. service and remarkable for  (1) history of fibroid, (2) history of preterm labor with delivery at 36  weeks, (3) history of PIH, (4) history of hyperemesis, and (5) group B strep  negative.   OB HISTORY:  Remarkable for abortion in 1995 and a vaginal delivery in 1998  of a female infant at [redacted] weeks gestation, weighing 4 pounds, complicated by  pregnancy-induced hypertension and preterm labor.   MEDICAL HISTORY:  Remarkable for history of hyperemesis, hypertension,  premature labor, and intraoperative varicella.   FAMILY HISTORY:  Remarkable for sister with hypertension, the father with  diabetes, the mother with migraines.   GENETIC HISTORY:  Unremarkable.   SOCIAL HISTORY:  The patient is single but involved with Anthony [place], who  is supportive.  She is of the [employer].  She denies any alcohol,  tobacco, or drug use.   OBJECTIVE DATA:  VITAL SIGNS:  Stable.  Blood pressure is ranging 151-178/90-  110.  HEENT: Within normal limits.  NECK:  Thyroid normal and not enlarged.  CHEST:  Clear to auscultation bilaterally.  HEART:  Heart rate regular rate and rhythm.  ABDOMEN:  Gravid at 38 cm, vertex to Leopold's.  EFM shows a fetal heart  rate of 145, excels to 160. Uterine contractions every three minutes.  PELVIC:   Cervical exam initially was 1 cm.  After 45 minutes, it was 2-3 cm,  90% effaced, and -2 station with a vertex presentation and bulging  membranes.  Vertex was confirmed by bedside ultrasound.  Leaking was noted  and was nitrazine positive and fern negative.  However, ferning test was  done after lubricant was used.  EXTREMITIES:  1+ edema on lower extremities only.   LABORATORY DATA:  Cathed U/A shows negative protein and specific gravity of  less than 1.005.  PIH labs were drawn.  Platelet count 313.  All other PIH  labs within normal limits.  Uric acid 3.9.   ASSESSMENT:  1. Intrauterine pregnancy at 37-6/7 weeks.  2. Latent phase of labor.  3. Hypertension without evidence of preeclampsia.  4. Group B strep negative.    PLAN:  1. Consulted Dr. Dierdre Forth.  Admit  to a birthing suite.  2. Routine M.D. orders.  3. Epidural.  4. Further orders to follow.     Marie L. Williams, C.N.M.                 Hal Morales, M.D.    MLW/MEDQ  D:  09/10/2002  T:  09/10/2002  Job:  [id number]

## 2011-06-22 LAB — DIFFERENTIAL
Basophils Relative: 0
Monocytes Relative: 5

## 2011-06-22 LAB — COMPREHENSIVE METABOLIC PANEL
ALT: 264 — ABNORMAL HIGH
AST: 130 — ABNORMAL HIGH
CO2: 22
Calcium: 8.1 — ABNORMAL LOW
Chloride: 109
Creatinine, Ser: 0.61
GFR calc Af Amer: >60
GFR calc non Af Amer: >60
Glucose, Bld: 100 — ABNORMAL HIGH
Sodium: 135
Total Bilirubin: 6.4 — ABNORMAL HIGH

## 2011-06-22 LAB — HEPATITIS PANEL, ACUTE
HCV Ab: NEGATIVE
Hepatitis B Surface Ag: NEGATIVE

## 2011-06-22 LAB — CBC
HCT: 30.7 — ABNORMAL LOW
HCT: 36
Hemoglobin: 10.5 — ABNORMAL LOW
Hemoglobin: 12.2
MCHC: 34.1
MCV: 83.8
Platelets: 349
Platelets: 397
RDW: 15.6 — ABNORMAL HIGH
RDW: 16.2 — ABNORMAL HIGH

## 2011-06-22 LAB — BASIC METABOLIC PANEL
BUN: 6
Chloride: 107
Glucose, Bld: 70
Potassium: 3.7

## 2011-06-22 LAB — HEPATIC FUNCTION PANEL
AST: 229 — ABNORMAL HIGH
Alkaline Phosphatase: 250 — ABNORMAL HIGH
Bilirubin, Direct: 5.5 — ABNORMAL HIGH
Total Bilirubin: 9.2 — ABNORMAL HIGH

## 2011-06-22 LAB — URINALYSIS, ROUTINE W REFLEX MICROSCOPIC
Nitrite: NEGATIVE
Protein, ur: NEGATIVE
Urobilinogen, UA: 0.2

## 2011-06-22 LAB — URINE MICROSCOPIC-ADD ON

## 2011-06-22 LAB — PROTIME-INR: INR: 1

## 2011-06-23 LAB — DIFFERENTIAL
Lymphocytes Relative: 10 — ABNORMAL LOW
Neutrophils Relative %: 82 — ABNORMAL HIGH

## 2011-06-23 LAB — COMPREHENSIVE METABOLIC PANEL
ALT: 260 — ABNORMAL HIGH
AST: 179 — ABNORMAL HIGH
AST: 195 — ABNORMAL HIGH
BUN: 1 — ABNORMAL LOW
BUN: 7
CO2: 26
CO2: 28
Calcium: 8.3 — ABNORMAL LOW
Calcium: 8.4
Chloride: 98
Creatinine, Ser: 0.64
GFR calc Af Amer: >60
GFR calc non Af Amer: >60
GFR calc non Af Amer: >60
Glucose, Bld: 111 — ABNORMAL HIGH
Glucose, Bld: 90
Potassium: 3.6
Sodium: 135
Total Bilirubin: 11.8 — ABNORMAL HIGH
Total Protein: 6.3

## 2011-06-23 LAB — CBC
HCT: 29.9 — ABNORMAL LOW
HCT: 36
Hemoglobin: 10.4 — ABNORMAL LOW
Hemoglobin: 12.4
MCHC: 34.4
MCHC: 34.7
MCV: 83.8
RBC: 4.29
RDW: 16.5 — ABNORMAL HIGH
WBC: 9.9

## 2011-06-23 LAB — URINALYSIS, ROUTINE W REFLEX MICROSCOPIC
Ketones, ur: NEGATIVE
Nitrite: NEGATIVE
Urobilinogen, UA: 1

## 2011-06-23 LAB — HEPATIC FUNCTION PANEL
ALT: 195 — ABNORMAL HIGH
AST: 75 — ABNORMAL HIGH
Albumin: 2.4 — ABNORMAL LOW
Total Protein: 6.1

## 2011-06-23 LAB — LIPASE, BLOOD: Lipase: 25

## 2011-06-27 LAB — COMPREHENSIVE METABOLIC PANEL
ALT: 11
AST: 13
Alkaline Phosphatase: 57
Glucose, Bld: 113 — ABNORMAL HIGH
Potassium: 3.9
Sodium: 137
Total Protein: 7

## 2011-06-27 LAB — DIFFERENTIAL
Basophils Relative: 0
Eosinophils Absolute: 0.2
Eosinophils Relative: 1
Neutro Abs: 14.6 — ABNORMAL HIGH

## 2011-06-27 LAB — CBC
Hemoglobin: 12
RBC: 4.24
RDW: 13.8

## 2011-06-27 LAB — URINALYSIS, ROUTINE W REFLEX MICROSCOPIC
Glucose, UA: NEGATIVE
Specific Gravity, Urine: 1.018
Urobilinogen, UA: 1

## 2011-06-27 LAB — URINE MICROSCOPIC-ADD ON

## 2011-07-01 LAB — CULTURE, BLOOD (ROUTINE X 2): Culture: NO GROWTH

## 2011-07-01 LAB — CBC
HCT: 34.1 — ABNORMAL LOW
Hemoglobin: 11.2 — ABNORMAL LOW
MCHC: 33.3
MCHC: 33.3
MCV: 83.5
MCV: 85.2
Platelets: 290
Platelets: 372
RBC: 4.81
RDW: 14
RDW: 14.6
WBC: 7.4

## 2011-07-01 LAB — DIFFERENTIAL
Basophils Absolute: 0
Basophils Absolute: 0
Basophils Relative: 0
Basophils Relative: 0
Basophils Relative: 3 — ABNORMAL HIGH
Eosinophils Absolute: 0
Eosinophils Absolute: 0.2
Eosinophils Relative: 0
Lymphocytes Relative: 2 — ABNORMAL LOW
Lymphocytes Relative: 8 — ABNORMAL LOW
Lymphs Abs: 0.2 — ABNORMAL LOW
Monocytes Absolute: 0.5
Monocytes Relative: 4
Monocytes Relative: 8
Neutro Abs: 11.4 — ABNORMAL HIGH
Neutro Abs: 3.7
Neutro Abs: 6.2
Neutrophils Relative %: 49
Neutrophils Relative %: 81 — ABNORMAL HIGH
Neutrophils Relative %: 94 — ABNORMAL HIGH

## 2011-07-01 LAB — URINE CULTURE
Colony Count: NO GROWTH
Culture: NO GROWTH

## 2011-07-01 LAB — COMPREHENSIVE METABOLIC PANEL
ALT: 1043 — ABNORMAL HIGH
ALT: 209 — ABNORMAL HIGH
AST: 36
Albumin: 3.3 — ABNORMAL LOW
Albumin: 3.6
Alkaline Phosphatase: 115
Alkaline Phosphatase: 122 — ABNORMAL HIGH
Alkaline Phosphatase: 145 — ABNORMAL HIGH
BUN: 2 — ABNORMAL LOW
BUN: 5 — ABNORMAL LOW
Calcium: 9.4
Chloride: 104
Creatinine, Ser: 0.48
Glucose, Bld: 106 — ABNORMAL HIGH
Potassium: 3.3 — ABNORMAL LOW
Potassium: 3.5
Potassium: 4.3
Sodium: 140
Sodium: 141
Total Bilirubin: 2.4 — ABNORMAL HIGH
Total Protein: 6.3
Total Protein: 7.3
Total Protein: 7.8

## 2011-07-01 LAB — URINALYSIS, ROUTINE W REFLEX MICROSCOPIC
Glucose, UA: NEGATIVE
Glucose, UA: NEGATIVE
Leukocytes, UA: NEGATIVE
Nitrite: NEGATIVE
Specific Gravity, Urine: 1.021
Specific Gravity, Urine: 1.036 — ABNORMAL HIGH
pH: 5.5
pH: 6

## 2011-07-01 LAB — URINE MICROSCOPIC-ADD ON

## 2011-07-01 LAB — AMYLASE: Amylase: 126

## 2011-07-07 LAB — URINALYSIS, ROUTINE W REFLEX MICROSCOPIC
Nitrite: NEGATIVE
Protein, ur: NEGATIVE mg/dL
Urobilinogen, UA: 0.2 mg/dL (ref 0.0–1.0)

## 2011-07-07 LAB — PREGNANCY, URINE: Preg Test, Ur: NEGATIVE

## 2011-07-11 LAB — DIFFERENTIAL
Eosinophils Relative: 3
Lymphocytes Relative: 30
Lymphs Abs: 2.9
Monocytes Relative: 8

## 2011-07-11 LAB — CBC
MCHC: 33.3
MCV: 84.2
Platelets: 335
RDW: 14.1

## 2011-07-11 LAB — COMPREHENSIVE METABOLIC PANEL
AST: 17
Albumin: 3.3 — ABNORMAL LOW
CO2: 26
Calcium: 9
Creatinine, Ser: 0.48
GFR calc Af Amer: >60
GFR calc non Af Amer: >60
Sodium: 139
Total Protein: 7.3

## 2011-07-11 LAB — URINE MICROSCOPIC-ADD ON

## 2011-07-11 LAB — URINALYSIS, ROUTINE W REFLEX MICROSCOPIC
Bilirubin Urine: NEGATIVE
Glucose, UA: NEGATIVE
Nitrite: NEGATIVE
Specific Gravity, Urine: 1.023
pH: 6

## 2011-07-11 LAB — LIPASE, BLOOD: Lipase: 16

## 2011-07-11 LAB — POCT PREGNANCY, URINE: Preg Test, Ur: NEGATIVE

## 2012-05-09 ENCOUNTER — Emergency Department (INDEPENDENT_AMBULATORY_CARE_PROVIDER_SITE_OTHER)
Admission: EM | Admit: 2012-05-09 | Discharge: 2012-05-09 | Disposition: A | Discharge status: Home / Self Care | Payer: Self-pay | Source: Home / Self Care

## 2012-05-09 ENCOUNTER — Encounter (HOSPITAL_COMMUNITY): Payer: Self-pay | Admitting: *Deleted

## 2012-05-09 DIAGNOSIS — Z91030 Bee allergy status: Secondary | ICD-10-CM

## 2012-05-09 DIAGNOSIS — T7840XA Allergy, unspecified, initial encounter: Secondary | ICD-10-CM

## 2012-05-09 MED ORDER — DIPHENHYDRAMINE HCL 25 MG PO CAPS: 25.0000 mg | ORAL_CAPSULE | Freq: Four times a day (QID) | ORAL | Status: DC | PRN

## 2012-05-09 NOTE — ED Provider Notes (Signed)
History     CSN: [id number]  Arrival date & time 05/09/12  1450   None     Chief Complaint  Patient presents with  . Insect Bite    (Consider location/radiation/quality/duration/timing/severity/associated sxs/prior treatment) The history is provided by the patient.  Patient reports she sat on a bee yesterday while driving, today noted puffiness around her eyes, no known lesion and reports she did not feel a sting.  Denies history of anaphylaxis related to bee stings, does not have an EpiPen at home for any other known anaphylactic reactions related to medications, insects or other elements.  History reviewed. No pertinent past medical history.  History reviewed. No pertinent past surgical history.  No family history on file.  History  Substance Use Topics  . Smoking status: Not on file  . Smokeless tobacco: Not on file  . Alcohol Use: Not on file    OB History    Grav Para Term Preterm Abortions TAB SAB Ect Mult Living                  Review of Systems  Constitutional: Negative.   HENT: Negative.   Eyes: Negative.        Puffiness  Respiratory: Negative.   Cardiovascular: Negative.     Allergies  Review of patient's allergies indicates no known allergies.  Home Medications  No current outpatient prescriptions on file.  BP 140/90  Pulse 69  Temp 99.4 F (37.4 C) (Oral)  Resp 16  SpO2 96%  LMP 04/21/2012  Physical Exam  Nursing note and vitals reviewed. Constitutional: She is oriented to person, place, and time. Vital signs are normal. She appears well-developed and well-nourished. She is active and cooperative.  HENT:  Head: Normocephalic.  Right Ear: Hearing, tympanic membrane, external ear and ear canal normal.  Left Ear: Hearing, tympanic membrane, external ear and ear canal normal.  Nose: Nose normal.  Mouth/Throat: Uvula is midline, oropharynx is clear and moist and mucous membranes are normal.  Eyes: Conjunctivae and EOM are normal. Pupils are  equal, round, and reactive to light. Right eye exhibits no discharge and no exudate. Left eye exhibits no discharge and no exudate. No scleral icterus.       Bilateral under eye dark circles, bilateral puffiness  Neck: Trachea normal. Neck supple.  Cardiovascular: Normal rate, regular rhythm, normal heart sounds, intact distal pulses and normal pulses.   Pulmonary/Chest: Effort normal and breath sounds normal.  Lymphadenopathy:       Head (right side): No submental, no submandibular, no tonsillar, no preauricular, no posterior auricular and no occipital adenopathy present.       Head (left side): No submental, no submandibular, no tonsillar, no preauricular, no posterior auricular and no occipital adenopathy present.    She has no cervical adenopathy.  Neurological: She is alert and oriented to person, place, and time. No cranial nerve deficit or sensory deficit.  Skin: Skin is warm and dry. No lesion and no rash noted.       No noted lesion or rash to right lateral thigh.  Psychiatric: She has a normal mood and affect. Her speech is normal and behavior is normal. Judgment and thought content normal. Cognition and memory are normal.    ED Course  Procedures (including critical care time)  Labs Reviewed - No data to display No results found.   1. H/O bee sting allergy       MDM  Continue benadryl as needed.  There are no signs  of allergic reaction or anaphlaxis related to insect sting.  Cool compresses twice daily.  You may benefit from an antihistamine such as Zyrtec or Claritin daily.  RTC or follow up with your primary care provider if symptoms worsen.     Johnsie Kindred, NP 05/09/12 1639

## 2012-05-09 NOTE — ED Notes (Signed)
Pt    Reports  She  Was     Stung  Yesterday    By a  Bee   In  Her r  Thigh        She  Reports  Some  puffyness  Around     Her  Eyes  But  Has no  Angio edema      Sj=he  Displays  No  Signs  Of any  resp  distress  She  Is  Speaking in  Complete  sentances         Her  Skin is  Warm and  Dry

## 2012-05-14 NOTE — ED Provider Notes (Signed)
Medical screening examination/treatment/procedure(s) were performed by resident physician or non-physician practitioner and as supervising physician I was immediately available for consultation/collaboration.   Barkley Bruns MD.    Linna Hoff, MD 05/14/12 [phone]

## 2014-05-29 ENCOUNTER — Encounter (HOSPITAL_COMMUNITY): Payer: Self-pay | Admitting: Emergency Medicine

## 2014-05-29 ENCOUNTER — Emergency Department (HOSPITAL_COMMUNITY)
Admission: EM | Admit: 2014-05-29 | Discharge: 2014-05-29 | Disposition: A | Discharge status: Home / Self Care | Payer: Self-pay | Attending: Emergency Medicine | Admitting: Emergency Medicine

## 2014-05-29 DIAGNOSIS — R21 Rash and other nonspecific skin eruption: Secondary | ICD-10-CM | POA: Insufficient documentation

## 2014-05-29 DIAGNOSIS — L259 Unspecified contact dermatitis, unspecified cause: Secondary | ICD-10-CM | POA: Insufficient documentation

## 2014-05-29 MED ORDER — PREDNISONE 20 MG PO TABS: ORAL_TABLET | ORAL | Status: DC

## 2014-05-29 MED ORDER — HYDROXYZINE HCL 25 MG PO TABS: 25.0000 mg | ORAL_TABLET | Freq: Four times a day (QID) | ORAL | Status: DC | PRN

## 2014-05-29 MED ORDER — TRIAMCINOLONE ACETONIDE 0.1 % EX CREA: 1.0000 "application " | TOPICAL_CREAM | Freq: Three times a day (TID) | CUTANEOUS | Status: DC

## 2014-05-29 NOTE — Discharge Instructions (Signed)

## 2014-05-29 NOTE — ED Provider Notes (Signed)
  Chief Complaint    Chief Complaint  Patient presents with  . Rash    History of Present Illness      [name] is a 38 year old female who's had a two-day history of a pruritic rash on her breasts, medial thighs, and antecubital fossa is. She cannot think of any particular exposure to allergens or antigens. No new foods or medications. No exposure to poison ivy. No bites or stings. She denies any systemic symptoms such as fever, chills, headache, stiff neck, sore throat, or URI symptoms. She denies any difficulty breathing or swelling of the lips, tongue, or throat.  Review of Systems   Other than as noted above, the patient denies any of the following symptoms: Systemic:  No fever or chills. ENT:  No nasal congestion, rhinorrhea, sore throat, swelling of lips, tongue or throat. Resp:  No cough, wheezing, or shortness of breath.  PMFSH    Past medical history, family history, social history, meds, and allergies were reviewed.   Physical Exam     Vital signs:  BP 130/81  Pulse 85  Temp(Src) 98.5 F (36.9 C) (Oral)  Resp 18  Ht  (1.575 m)  Wt 193 lb (87.544 kg)  BMI 35.29 kg/m2  SpO2 100%  LMP 04/29/2014 Gen:  Alert, oriented, in no distress. ENT:  Pharynx clear, no intraoral lesions, moist mucous membranes. Lungs:  Clear to auscultation. Skin:  There is a raised, red, erythematous rash on the breasts, in the antecubital fossa is, on the medial thighs. Some of the lesions were streaky and patchy, there is no vesiculation.  Assessment    The encounter diagnosis was Contact dermatitis.  Plan     1.  Meds:  The following meds were prescribed:   New Prescriptions   HYDROXYZINE (ATARAX/VISTARIL) 25 MG TABLET    Take 1 tablet (25 mg total) by mouth every 6 (six) hours as needed for itching.   PREDNISONE (DELTASONE) 20 MG TABLET    Take 3 daily for 5 days, 2 daily for 5 days, 1 daily for 5 days.   TRIAMCINOLONE CREAM (KENALOG) 0.1 %    Apply 1 application  topically 3 (three) times daily.    2.  Patient Education/Counseling:  The patient was given appropriate handouts, self care instructions, and instructed in symptomatic relief.    3.  Follow up:  The patient was told to follow up here if no better in 3 to 4 days, or sooner if becoming worse in any way, and given some red flag symptoms such as worsening rash, fever, or difficulty breathing which would prompt immediate return.  Follow up with Dr. Para Skeans if rash persists.      Reuben Likes, MD 05/29/14 [phone]

## 2014-05-29 NOTE — ED Notes (Signed)
Patient states had been to a friends house and she sat on his bed and this morning she noticed rash and itching on arms, under breasts and legs.   Patient states no change in detergents, deoderant, etc.

## 2014-06-09 ENCOUNTER — Emergency Department (HOSPITAL_COMMUNITY)
Admission: EM | Admit: 2014-06-09 | Discharge: 2014-06-09 | Disposition: A | Discharge status: Home / Self Care | Payer: Self-pay | Attending: Emergency Medicine | Admitting: Emergency Medicine

## 2014-06-09 ENCOUNTER — Encounter (HOSPITAL_COMMUNITY): Payer: Self-pay | Admitting: Emergency Medicine

## 2014-06-09 DIAGNOSIS — Z87891 Personal history of nicotine dependence: Secondary | ICD-10-CM | POA: Insufficient documentation

## 2014-06-09 DIAGNOSIS — R21 Rash and other nonspecific skin eruption: Secondary | ICD-10-CM | POA: Insufficient documentation

## 2014-06-09 MED ORDER — PREDNISONE 20 MG PO TABS
60.0000 mg | ORAL_TABLET | Freq: Once | ORAL | Status: AC
  Administered 2014-06-09: 60 mg via ORAL
  Filled 2014-06-09: qty 3

## 2014-06-09 MED ORDER — PREDNISONE 50 MG PO TABS: 50.0000 mg | ORAL_TABLET | Freq: Every day | ORAL | Status: DC

## 2014-06-09 MED ORDER — TRIAMCINOLONE ACETONIDE 0.1 % EX CREA: 1.0000 "application " | TOPICAL_CREAM | Freq: Three times a day (TID) | CUTANEOUS | Status: DC

## 2014-06-09 NOTE — Discharge Instructions (Signed)
Return here as needed.  Follow up with an urgent care for primary care Dr. Use Benadryl for itching.

## 2014-06-09 NOTE — ED Provider Notes (Signed)
CSN: [id number]     Arrival date & time 06/09/14  1054 History   First MD Initiated Contact with Patient 06/09/14 1103     Chief Complaint  Patient presents with  . Rash     (Consider location/radiation/quality/duration/timing/severity/associated sxs/prior Treatment) Patient is a 38 y.o. female presenting with rash. The history is provided by the patient.  Rash Location:  Shoulder/arm and leg Shoulder/arm rash location:  R upper arm Leg rash location:  R upper leg and L upper leg Severity:  Mild Onset quality:  Gradual Duration:  1 day Timing:  Constant Progression:  Unchanged Chronicity:  New Context: not animal contact, not chemical exposure, not diapers, not eggs, not exposure to similar rash, not food, not hot tub use, not insect bite/sting, not medications, not new detergent/soap, not nuts, not plant contact, not pollen, not pregnancy, not sick contacts and not sun exposure   Relieved by:  Nothing Ineffective treatments:  None tried Associated symptoms: no abdominal pain, no diarrhea, no fatigue, no fever, no headaches, no hoarse voice, no induration, no joint pain, no myalgias, no nausea, no periorbital edema, no shortness of breath, no sore throat, no throat swelling, no tongue swelling, no URI, not vomiting and not wheezing     History reviewed. No pertinent past medical history. History reviewed. No pertinent past surgical history. History reviewed. No pertinent family history. History  Substance Use Topics  . Smoking status: Former Games developer  . Smokeless tobacco: Not on file  . Alcohol Use: No   OB History   Grav Para Term Preterm Abortions TAB SAB Ect Mult Living                 Review of Systems  Constitutional: Negative for fever and fatigue.  HENT: Negative for hoarse voice and sore throat.   Respiratory: Negative for shortness of breath and wheezing.   Gastrointestinal: Negative for nausea, vomiting, abdominal pain and diarrhea.  Musculoskeletal: Negative for  arthralgias and myalgias.  Skin: Positive for rash.  Neurological: Negative for headaches.      Allergies  Review of patient's allergies indicates no known allergies.  Home Medications   Prior to Admission medications   Medication Sig Start Date End Date Taking? Authorizing Provider  triamcinolone cream (KENALOG) 0.1 % Apply 1 application topically 3 (three) times daily. 05/29/14  Yes Reuben Likes, MD   BP 133/91  Pulse 76  Temp(Src) 98.5 F (36.9 C) (Oral)  Resp 18  SpO2 100%  LMP 04/29/2014 Physical Exam  Nursing note and vitals reviewed. Constitutional: She appears well-developed and well-nourished.  HENT:  Head: Normocephalic and atraumatic.  Cardiovascular: Normal rate and regular rhythm.   Pulmonary/Chest: Effort normal and breath sounds normal.  Musculoskeletal: She exhibits no edema.  Skin: Skin is warm and dry. Rash noted.       ED Course  Procedures (including critical care time) Patient retreated for contact type rash.  Told to return here as needed.  Advised to followup with a primary care doctor or dermatologist    Carlyle Dolly, PA-C 06/09/14 1135

## 2014-06-09 NOTE — ED Notes (Signed)
C/o rash on bilateral arms and left leg starting yesterday. States "it itches off and on. I sweat like I've been in the gym." C/o rash on leg being the worst. Has been outside recently. PA at bedside.

## 2014-06-09 NOTE — ED Notes (Signed)
Advised to start Prednisone regimen tomorrow d/t already having dose today. No other questions/concerns. Ambulatory with AVS in hand.

## 2014-06-10 NOTE — ED Provider Notes (Signed)
Medical screening examination/treatment/procedure(s) were performed by non-physician practitioner and as supervising physician I was immediately available for consultation/collaboration.   EKG Interpretation None        Marysol Wellnitz, MD 06/10/14 0708 

## 2016-02-12 ENCOUNTER — Emergency Department (HOSPITAL_COMMUNITY)
Admission: EM | Admit: 2016-02-12 | Discharge: 2016-02-12 | Disposition: A | Discharge status: Home / Self Care | Payer: BLUE CROSS/BLUE SHIELD | Attending: Emergency Medicine | Admitting: Emergency Medicine

## 2016-02-12 ENCOUNTER — Encounter (HOSPITAL_COMMUNITY): Payer: Self-pay

## 2016-02-12 DIAGNOSIS — Y9389 Activity, other specified: Secondary | ICD-10-CM | POA: Diagnosis not present

## 2016-02-12 DIAGNOSIS — Y9289 Other specified places as the place of occurrence of the external cause: Secondary | ICD-10-CM | POA: Insufficient documentation

## 2016-02-12 DIAGNOSIS — Z7952 Long term (current) use of systemic steroids: Secondary | ICD-10-CM | POA: Diagnosis not present

## 2016-02-12 DIAGNOSIS — T63444A Toxic effect of venom of bees, undetermined, initial encounter: Secondary | ICD-10-CM | POA: Diagnosis not present

## 2016-02-12 DIAGNOSIS — Z87891 Personal history of nicotine dependence: Secondary | ICD-10-CM | POA: Insufficient documentation

## 2016-02-12 DIAGNOSIS — Y998 Other external cause status: Secondary | ICD-10-CM | POA: Insufficient documentation

## 2016-02-12 NOTE — Discharge Instructions (Signed)
Recommend benadryl and cool compresses to help with symptoms.  Take benadryl at night if it makes you too sleep during the day. Return here for new/worsening symptoms.

## 2016-02-12 NOTE — ED Provider Notes (Signed)
CSN: [id number]     Arrival date & time 02/12/16  1624 History  By signing my name below, I, [name], attest that this documentation has been prepared under the direction and in the presence of non-physician practitioner, Sharilyn SitesLisa Saul Dorsi, PA-C. Electronically Signed: Marisue HumbleMichelle [name], Scribe. 02/12/2016. 4:46 PM.   Chief Complaint  Patient presents with  . Insect Bite   The history is provided by the patient. No language interpreter was used.   HPI Comments:  Amanda StallCharlene D Mullens is a 40 y.o. female who presents to the Emergency Department complaining of bee sting to right thigh yesterday. She is unsure what kind of bee stung her. Pt reports associated itching, swelling and warmth to the area. She took Benadryl which made her sleepy and did not provide relief. No alleviating factors noted. Denies shortness of breath, sensation of oral swelling, or rash.  No known allergies.  History reviewed. No pertinent past medical history. History reviewed. No pertinent past surgical history. History reviewed. No pertinent family history. Social History  Substance Use Topics  . Smoking status: Former Games developermoker  . Smokeless tobacco: None  . Alcohol Use: No   OB History    No data available     Review of Systems  Respiratory: Negative for shortness of breath.   Skin: Positive for wound (insect bite).  All other systems reviewed and are negative.  Allergies  Review of patient's allergies indicates no known allergies.  Home Medications   Prior to Admission medications   Medication Sig Start Date End Date Taking? Authorizing Provider  predniSONE (DELTASONE) 50 MG tablet Take 1 tablet (50 mg total) by mouth daily. 06/09/14   Charlestine Nighthristopher Lawyer, PA-C  triamcinolone cream (KENALOG) 0.1 % Apply 1 application topically 3 (three) times daily. 06/09/14   Christopher Lawyer, PA-C   BP 132/90 mmHg  Pulse 75  Temp(Src) 98.5 [name] (36.9 C) (Oral)  Resp 16  Ht 5\' 2"  (1.575 m)  Wt 204 lb (92.534 kg)  BMI  37.30 kg/m2  SpO2 100%   Physical Exam  Constitutional: She is oriented to person, place, and time. She appears well-developed and well-nourished.  HENT:  Head: Normocephalic and atraumatic.  Mouth/Throat: Oropharynx is clear and moist.  Airway clear, no swelling or lesions  Eyes: Conjunctivae and EOM are normal. Pupils are equal, round, and reactive to light.  Neck: Normal range of motion.  Cardiovascular: Normal rate, regular rhythm and normal heart sounds.   Pulmonary/Chest: Effort normal and breath sounds normal.  Abdominal: Soft. Bowel sounds are normal.  Musculoskeletal: Normal range of motion.  Bee sting to left anterior thigh with localized reaction and warmth; mild erythema without induration; no drainage; no retained stinger or other foreign body  Neurological: She is alert and oriented to person, place, and time.  Skin: Skin is warm and dry. No rash noted.  No rashes  Psychiatric: She has a normal mood and affect.  Nursing note and vitals reviewed.   ED Course  Procedures  DIAGNOSTIC STUDIES:  Oxygen Saturation is 100% on RA, normal by my interpretation.    COORDINATION OF CARE:  4:45 PM Recommended pt take Benadryl. Discussed treatment plan with pt at bedside and pt agreed to plan.  Labs Review Labs Reviewed - No data to display  Imaging Review No results found. I have personally reviewed and evaluated these images and lab results as part of my medical decision-making.   EKG Interpretation None      MDM   Final diagnoses:  Bee sting,  undetermined intent, initial encounter   [name] here with bee sting to left anterior thigh with localized reaction.  No evidence of superimposed infection or cellulitis. No apparent systemic reaction with hives, oral swelling, or shortness of breath. Recommended Benadryl, cool compresses for home.  Discussed plan with patient, he/she acknowledged understanding and agreed with plan of care.  Return precautions given for new  or worsening symptoms.  I personally performed the services described in this documentation, which was scribed in my presence. The recorded information has been reviewed and is accurate.  Garlon Hatchet, PA-C 02/12/16 1652  Tilden Fossa, MD 02/13/16 [phone]

## 2016-02-12 NOTE — ED Notes (Signed)
Pt states a bee stung her on her L thigh. Denies any SOB, rash/hives. States some irritaiton around site.

## 2016-02-12 NOTE — ED Notes (Signed)
Pa saw before me  Pt alert see triage and her note

## 2016-12-01 ENCOUNTER — Emergency Department (HOSPITAL_COMMUNITY)
Admission: EM | Admit: 2016-12-01 | Discharge: 2016-12-01 | Disposition: A | Discharge status: Home / Self Care | Payer: Medicaid Other | Attending: Emergency Medicine | Admitting: Emergency Medicine

## 2016-12-01 ENCOUNTER — Encounter (HOSPITAL_COMMUNITY): Payer: Self-pay

## 2016-12-01 DIAGNOSIS — S61237A Puncture wound without foreign body of left little finger without damage to nail, initial encounter: Secondary | ICD-10-CM | POA: Insufficient documentation

## 2016-12-01 DIAGNOSIS — Z79899 Other long term (current) drug therapy: Secondary | ICD-10-CM | POA: Diagnosis not present

## 2016-12-01 DIAGNOSIS — Y939 Activity, unspecified: Secondary | ICD-10-CM | POA: Insufficient documentation

## 2016-12-01 DIAGNOSIS — Z87891 Personal history of nicotine dependence: Secondary | ICD-10-CM | POA: Diagnosis not present

## 2016-12-01 DIAGNOSIS — Y929 Unspecified place or not applicable: Secondary | ICD-10-CM | POA: Diagnosis not present

## 2016-12-01 DIAGNOSIS — S61432A Puncture wound without foreign body of left hand, initial encounter: Secondary | ICD-10-CM

## 2016-12-01 DIAGNOSIS — Y999 Unspecified external cause status: Secondary | ICD-10-CM | POA: Insufficient documentation

## 2016-12-01 DIAGNOSIS — Z23 Encounter for immunization: Secondary | ICD-10-CM | POA: Insufficient documentation

## 2016-12-01 DIAGNOSIS — W268XXA Contact with other sharp object(s), not elsewhere classified, initial encounter: Secondary | ICD-10-CM | POA: Insufficient documentation

## 2016-12-01 MED ORDER — SULFAMETHOXAZOLE-TRIMETHOPRIM 800-160 MG PO TABS: 1.0000 | ORAL_TABLET | Freq: Two times a day (BID) | ORAL | 0 refills | Status: AC

## 2016-12-01 MED ORDER — SULFAMETHOXAZOLE-TRIMETHOPRIM 800-160 MG PO TABS
1.0000 | ORAL_TABLET | Freq: Once | ORAL | Status: AC
  Administered 2016-12-01: 1 via ORAL
  Filled 2016-12-01: qty 1

## 2016-12-01 MED ORDER — TETANUS-DIPHTH-ACELL PERTUSSIS 5-2.5-18.5 LF-MCG/0.5 IM SUSP
0.5000 mL | Freq: Once | INTRAMUSCULAR | Status: AC
  Administered 2016-12-01: 0.5 mL via INTRAMUSCULAR
  Filled 2016-12-01: qty 0.5

## 2016-12-01 NOTE — ED Provider Notes (Signed)
[facility] Provider Note   CSN: [id number] Arrival date & time: 12/01/16  1759  By signing my name below, I, Rosario Adie, attest that this documentation has been prepared under the direction and in the presence of Gerhard Munch, MD. Electronically Signed: Rosario Adie, ED Scribe. 12/01/16. 7:50 PM.  History   Chief Complaint Chief Complaint  Patient presents with  . Extremity Laceration   The history is provided by the patient. No language interpreter was used.    [name] is a 41 y.o. female with no pertinent PMHx, who presents to the Emergency Department complaining of a wound sustained to the palmar left fifth digit that occurred 24 hours ago. Bleeding has resolved at this time. Per pt, she was opening a can last night when a clean, sharp metal edge came across her palmar left fifth digit, sustaining her wound. She reports associated swelling and throbbing pain to the area. No other injuries. Pt took BC powder last night with mild relief of her pain. She denies fever, chest pain, shortness of breath, or any other associated symptoms. Tetanus is not UTD.   History reviewed. No pertinent past medical history.  Patient Active Problem List   Diagnosis Date Noted  . 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 KIDNEY STONE SURGERY     OB History    No data available     Home Medications    Prior to Admission medications   Medication Sig Start Date End Date Taking? Authorizing Provider  predniSONE (DELTASONE) 50 MG tablet Take 1 tablet (50 mg total) by mouth daily. 06/09/14   Charlestine Night, PA-C  triamcinolone cream (KENALOG) 0.1 % Apply 1 application topically 3 (three) times daily. 06/09/14   Charlestine Night, PA-C   Family History No family history on file.  Social History Social History  Substance Use Topics  . Smoking status: Former Games developer  . Smokeless tobacco:  Never Used  . Alcohol use No   Allergies   Patient has no known allergies.  Review of Systems Review of Systems  Constitutional: Negative for fever.  Respiratory: Negative for shortness of breath.   Cardiovascular: Negative for chest pain.  Musculoskeletal: Positive for joint swelling and myalgias.  Skin: Positive for wound.  All other systems reviewed and are negative.  Physical Exam Updated Vital Signs BP 162/98 (BP Location: Right Arm)   Pulse 89   Temp 99.1 F (37.3 C) (Oral)   Resp 18   Ht 5\' 2"  (1.575 m)   Wt 214 lb (97.1 kg)   SpO2 100%   BMI 39.14 kg/m   Physical Exam  Constitutional: She appears well-developed and well-nourished. No distress.  HENT:  Head: Normocephalic and atraumatic.  Eyes: Conjunctivae are normal.  Neck: Normal range of motion.  Cardiovascular: Normal rate.   Pulmonary/Chest: Effort normal.  Abdominal: She exhibits no distension.  Musculoskeletal: Normal range of motion.  Punctate wound just lateral to the MCP joint of the fifth digit with mild edema on the distal medial hand. Appropriate strength and ability to flex and extend the fifth digit of the left hand as well as the rest of the left hand.  Neurological: She is alert.  Skin: No pallor.  Psychiatric: She has a normal mood and affect. Her behavior is normal.  Nursing note and vitals reviewed.  ED Treatments / Results  DIAGNOSTIC STUDIES: Oxygen Saturation is 100% on RA, normal by my interpretation.  COORDINATION OF CARE: 7:50 PM-Discussed next steps with pt. Pt verbalized understanding and is agreeable with the plan.   Procedures Procedures   Medications Ordered in ED Medications  sulfamethoxazole-trimethoprim (BACTRIM DS,SEPTRA DS) 800-160 MG per tablet 1 tablet (not administered)  Tdap (BOOSTRIX) injection 0.5 mL (not administered)    Initial Impression / Assessment and Plan / ED Course  I have reviewed the triage vital signs and the nursing notes.  Pertinent labs  & imaging results that were available during my care of the patient were reviewed by me and considered in my medical decision making (see chart for details).  Patient presents 24 hours after sustaining a puncture wound to her left hand. Here the patient does have evidence for infection, but no current evidence for tendon or deep space involvement. No evidence for sepsis, bacteremia. Patient started on antibiotics, and after discussion of return precautions, follow-up instructions, she was discharged in stable condition.  Final Clinical Impressions(s) / ED Diagnoses   Final diagnoses:  Puncture wound of left hand, foreign body presence unspecified, initial encounter   New Prescriptions New Prescriptions   SULFAMETHOXAZOLE-TRIMETHOPRIM (BACTRIM DS,SEPTRA DS) 800-160 MG TABLET    Take 1 tablet by mouth 2 (two) times daily.     I personally performed the services described in this documentation, which was scribed in my presence. The recorded information has been reviewed and is accurate.         Gerhard Munchobert Verdie Barrows, MD 12/01/16 2016

## 2016-12-01 NOTE — ED Triage Notes (Signed)
Per Pt, Pt is coming from home with complaints of hand lac to the left hand that started swelling today. Pt denies fevers. Reports 7/10 pain.

## 2017-05-19 ENCOUNTER — Encounter (HOSPITAL_COMMUNITY): Payer: Self-pay | Admitting: Physician Assistant

## 2017-05-19 ENCOUNTER — Ambulatory Visit (HOSPITAL_COMMUNITY)
Admission: EM | Admit: 2017-05-19 | Discharge: 2017-05-19 | Disposition: A | Discharge status: Home / Self Care | Payer: Medicaid Other | Attending: Family Medicine | Admitting: Family Medicine

## 2017-05-19 DIAGNOSIS — L03115 Cellulitis of right lower limb: Secondary | ICD-10-CM

## 2017-05-19 DIAGNOSIS — T63441A Toxic effect of venom of bees, accidental (unintentional), initial encounter: Secondary | ICD-10-CM

## 2017-05-19 MED ORDER — CEPHALEXIN 500 MG PO CAPS: 500.0000 mg | ORAL_CAPSULE | Freq: Four times a day (QID) | ORAL | 0 refills | Status: AC

## 2017-05-19 NOTE — ED Triage Notes (Signed)
Pt here for bee sting onset 3 days ago. ... See providers note

## 2017-05-19 NOTE — ED Provider Notes (Signed)
[facility]    CSN: [id number] Arrival date & time: 05/19/17  1730     History   Chief Complaint Chief Complaint  Patient presents with  . Insect Bite    HPI ALLEAN MONTFORT is a 41 y.o. female.   41 year old female comes in for worsening redness, swelling, pain after bee sting 2 days ago. States pain is at the back of the right calf, which has also been itchy. She has taken Benadryl and used ice compress with some relief. Denies fever, chills, night sweats. Denies trouble breathing, trouble swallowing, swelling of the throat, chest pain.      History reviewed. No pertinent past medical history.  Patient Active Problem List   Diagnosis Date Noted  . 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 KIDNEY STONE SURGERY      OB History    No data available       Home Medications    Prior to Admission medications   Medication Sig Start Date End Date Taking? Authorizing Provider  cephALEXin (KEFLEX) 500 MG capsule Take 1 capsule (500 mg total) by mouth 4 (four) times daily. 05/19/17 05/24/17  Cathie Hoops, Amy V, PA-C  triamcinolone cream (KENALOG) 0.1 % Apply 1 application topically 3 (three) times daily. 06/09/14   Charlestine Night, PA-C    Family History History reviewed. No pertinent family history.  Social History Social History  Substance Use Topics  . Smoking status: Former Games developer  . Smokeless tobacco: Never Used  . Alcohol use No     Allergies   Patient has no known allergies.   Review of Systems Review of Systems  Reason unable to perform ROS: See HPI as above.     Physical Exam Triage Vital Signs ED Triage Vitals  Enc Vitals Group     BP --      Pulse Rate 05/19/17 1832 68     Resp 05/19/17 1832 20     Temp 05/19/17 1832 99 F (37.2 C)     Temp Source 05/19/17 1832 Oral     SpO2 05/19/17 1832 100 %     Weight --      Height --      Head Circumference --     Peak Flow --      Pain Score 05/19/17 1833 6     Pain Loc --      Pain Edu? --      Excl. in GC? --    No data found.   Updated Vital Signs Pulse 68   Temp 99 F (37.2 C) (Oral)   Resp 20   LMP 05/19/2017   SpO2 100%    Physical Exam  Constitutional: She is oriented to person, place, and time. She appears well-developed and well-nourished. No distress.  Pulmonary/Chest: Effort normal. No respiratory distress.  Neurological: She is alert and oriented to person, place, and time.  Skin:  Right calf swelling, erythema, increased warmth. Small wound at center consistent with insect bite/bee sting. No palpable abscess. Tenderness on palpation.      UC Treatments / Results  Labs (all labs ordered are listed, but only abnormal results are displayed) Labs Reviewed - No data to display  EKG  EKG Interpretation None       Radiology No results found.  Procedures Procedures (including critical care time)  Medications Ordered in UC Medications - No data to display   Initial Impression / Assessment and  Plan / UC Course  I have reviewed the triage vital signs and the nursing notes.  Pertinent labs & imaging results that were available during my care of the patient were reviewed by me and considered in my medical decision making (see chart for details).    Discussed with patient exam consistent with cellulitis from bee sting. Start keflex as directed. Over the counter antihistamines for pruritis. Return precautions given.   Final Clinical Impressions(s) / UC Diagnoses   Final diagnoses:  Bee sting, accidental or unintentional, initial encounter  Cellulitis of right lower extremity    New Prescriptions New Prescriptions   CEPHALEXIN (KEFLEX) 500 MG CAPSULE    Take 1 capsule (500 mg total) by mouth 4 (four) times daily.      Belinda Fisher, PA-C 05/19/17 2109

## 2017-05-19 NOTE — Discharge Instructions (Signed)
Take Keflex as directed. Over-the-counter antihistamines such as Zyrtec, Allegra, Claritin for itchiness. Ice compress and elevation. Monitor for any worsening of symptoms, fever, chills, spreading redness, increased warmth, increased pain, follow-up for reevaluation. If experiencing any trouble breathing, trouble swallowing, swelling of the throat, go immediately to the emergency department.

## 2017-10-18 ENCOUNTER — Ambulatory Visit (HOSPITAL_COMMUNITY)
Admission: EM | Admit: 2017-10-18 | Discharge: 2017-10-18 | Disposition: A | Discharge status: Home / Self Care | Payer: Medicaid Other | Attending: Family Medicine | Admitting: Family Medicine

## 2017-10-18 ENCOUNTER — Other Ambulatory Visit: Payer: Self-pay

## 2017-10-18 ENCOUNTER — Encounter (HOSPITAL_COMMUNITY): Payer: Self-pay | Admitting: Emergency Medicine

## 2017-10-18 DIAGNOSIS — M79644 Pain in right finger(s): Secondary | ICD-10-CM

## 2017-10-18 DIAGNOSIS — L03011 Cellulitis of right finger: Secondary | ICD-10-CM

## 2017-10-18 MED ORDER — SULFAMETHOXAZOLE-TRIMETHOPRIM 800-160 MG PO TABS: 1.0000 | ORAL_TABLET | Freq: Two times a day (BID) | ORAL | 0 refills | Status: DC

## 2017-10-18 NOTE — ED Triage Notes (Signed)
Pt states she jammed her R pointer finger Saturday afternoon. R finger is swollen.

## 2017-10-18 NOTE — Discharge Instructions (Signed)
You may use over the counter ibuprofen or acetaminophen as needed for discomfort. ° °

## 2017-10-23 NOTE — ED Provider Notes (Signed)
  [facility]   [id number] 10/18/17 Arrival Time: 1725  ASSESSMENT & PLAN:  1. Paronychia of finger, right    Meds ordered this encounter  Medications  . sulfamethoxazole-trimethoprim (BACTRIM DS,SEPTRA DS) 800-160 MG tablet    Sig: Take 1 tablet by mouth 2 (two) times daily.    Dispense:  20 tablet    Refill:  0   Paronychia extends under proximal nail. Distal R second finger and fingernail cleaned with alcohol. Cautery pen used to place single hole in proximal nail. Significant pus expressed.  Reviewed expectations re: course of current medical issues. Questions answered. Outlined signs and symptoms indicating need for more acute intervention. Patient verbalized understanding. After Visit Summary given.  SUBJECTIVE: History from: patient. [name] is a 42 y.o. female who reports:  Pain/Discomfort of: right 2nd finger Onset abrupt a couple of days ago Frequency: persistent Injury/trama: yes, "kind of jammed my finger then noticed a lot of swelling yesterday" Describes as: localized aching tenderness of distal R 2nd finger Severity: moderate Progression: stable Relieved by: nothing specific Worsened by: any pressure on area  Associated symptoms: mild skin redness near nail  Extremity sensation changes or weakness: none Self treatment: has not tried OTCs for relief of pain  History of similar: no  ROS: As per HPI.   OBJECTIVE:  Vitals:   10/18/17 1742  BP: (!) 141/87  Pulse: 70  Resp: 18  Temp: 98.2 F (36.8 C)  SpO2: 100%    General appearance: alert; no distress Extremities: no cyanosis or edema; symmetrical with no gross deformities; paronychia of R 2nd finger; tender; no active drainage CV: normal extremity capillary refill Skin: warm and dry Neurologic: normal gait; normal symmetric reflexes in all extremities; normal sensation in all extremities Psychological: alert and cooperative; normal mood and affect  No Known  Allergies   Social History   Socioeconomic History  . Marital status: Single    Spouse name: Not on file  . Number of children: Not on file  . Years of education: Not on file  . Highest education level: Not on file  Social Needs  . Financial resource strain: Not on file  . Food insecurity - worry: Not on file  . Food insecurity - inability: Not on file  . Transportation needs - medical: Not on file  . Transportation needs - non-medical: Not on file  Occupational History  . Not on file  Tobacco Use  . Smoking status: Former Games developermoker  . Smokeless tobacco: Never Used  Substance and Sexual Activity  . Alcohol use: No  . Drug use: No  . Sexual activity: Not on file  Other Topics Concern  . Not on file  Social History Narrative  . Not on file   No family history on file. Past Surgical History:  Procedure Laterality Date  . KIDNEY STONE SURGERY        Mardella LaymanHagler, Gala Padovano, MD 10/23/17 1016

## 2020-06-05 ENCOUNTER — Ambulatory Visit (HOSPITAL_COMMUNITY)
Admission: EM | Admit: 2020-06-05 | Discharge: 2020-06-05 | Disposition: A | Discharge status: Home / Self Care | Payer: Medicaid Other | Attending: Internal Medicine | Admitting: Internal Medicine

## 2020-06-05 ENCOUNTER — Encounter (HOSPITAL_COMMUNITY): Payer: Self-pay | Admitting: Emergency Medicine

## 2020-06-05 ENCOUNTER — Other Ambulatory Visit: Payer: Self-pay

## 2020-06-05 DIAGNOSIS — K2900 Acute gastritis without bleeding: Secondary | ICD-10-CM

## 2020-06-05 MED ORDER — PANTOPRAZOLE SODIUM 20 MG PO TBEC: 20.0000 mg | DELAYED_RELEASE_TABLET | Freq: Every day | ORAL | 0 refills | Status: AC

## 2020-06-05 NOTE — ED Triage Notes (Signed)
Pt presents to [facility] for assessment of mid and upper abdominal pain starting Monday after she lifted her last tote for the night at work.  States pain is constant now, but diminishes with OTC pain relief.  Denies changes in bowel or bladder, denies n/v.

## 2020-06-05 NOTE — ED Provider Notes (Signed)
[facility]    CSN: [id number] Arrival date & time: 06/05/20  0805      History   Chief Complaint Chief Complaint  Patient presents with  . Abdominal Pain    HPI [name] is a 44 y.o. female comes to the urgent care with complaints of mid to upper abdominal pain which started on Monday.  Patient works third shift and said the symptoms started after she lifted her last tote.  She denies any nausea or vomiting.  No diarrhea.  Pain was initially intermittent, sharp and is currently gotten persistent and throbbing.  Patient denies any relieving factors.  She gets partial relief with Aleve.  Food makes it temporarily better.  Pain does not radiate to the back or around the rib.  No dizziness, near syncope or syncopal episodes.  No fever, chills, sore throat, difficulty swallowing or exposure to a COVID-19 positive person. History reviewed. No pertinent past medical history.  Patient Active Problem List   Diagnosis Date Noted  . 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 KIDNEY STONE SURGERY      OB History   No obstetric history on file.      Home Medications    Prior to Admission medications   Medication Sig Start Date End Date Taking? Authorizing Provider  pantoprazole (PROTONIX) 20 MG tablet Take 1 tablet (20 mg total) by mouth daily. 06/05/20   Lomax Poehler, Britta Mccreedy, MD    Family History History reviewed. No pertinent family history.  Social History Social History   Tobacco Use  . Smoking status: Former Games developer  . Smokeless tobacco: Never Used  Substance Use Topics  . Alcohol use: No  . Drug use: No     Allergies   Patient has no known allergies.   Review of Systems Review of Systems  Constitutional: Negative.   HENT: Negative.   Respiratory: Negative.   Gastrointestinal: Positive for abdominal pain. Negative for diarrhea, nausea and vomiting.  Genitourinary:  Negative.   Neurological: Negative.      Physical Exam Triage Vital Signs ED Triage Vitals  Enc Vitals Group     BP 06/05/20 0827 (!) 163/87     Pulse Rate 06/05/20 0827 68     Resp 06/05/20 0827 18     Temp 06/05/20 0827 98.1 F (36.7 C)     Temp Source 06/05/20 0827 Oral     SpO2 06/05/20 0827 100 %     Weight --      Height --      Head Circumference --      Peak Flow --      Pain Score 06/05/20 0828 0     Pain Loc --      Pain Edu? --      Excl. in GC? --    No data found.  Updated Vital Signs BP (!) 163/87 (BP Location: Left Arm)   Pulse 68   Temp 98.1 F (36.7 C) (Oral)   Resp 18   LMP 05/20/2020 (Approximate)   SpO2 100%   Visual Acuity Right Eye Distance:   Left Eye Distance:   Bilateral Distance:    Right Eye Near:   Left Eye Near:    Bilateral Near:     Physical Exam Vitals and nursing note reviewed.  Constitutional:      General: She is not in acute distress.    Appearance: She is not ill-appearing.  Abdominal:  General: Bowel sounds are normal.     Palpations: Abdomen is soft. There is no shifting dullness, hepatomegaly, splenomegaly or mass.     Tenderness: There is abdominal tenderness in the epigastric area. There is no guarding or rebound.     Hernia: No hernia is present.  Neurological:     Mental Status: She is alert.      UC Treatments / Results  Labs (all labs ordered are listed, but only abnormal results are displayed) Labs Reviewed - No data to display  EKG   Radiology No results found.  Procedures Procedures (including critical care time)  Medications Ordered in UC Medications - No data to display  Initial Impression / Assessment and Plan / UC Course  I have reviewed the triage vital signs and the nursing notes.  Pertinent labs & imaging results that were available during my care of the patient were reviewed by me and considered in my medical decision making (see chart for details).     1.  Acute gastritis  without hemorrhage: Avoid NSAIDs Protonix 20 mg orally daily Return precautions given If symptoms persist for more than a month for some medications-gastroenterology evaluation is recommended. Final Clinical Impressions(s) / UC Diagnoses   Final diagnoses:  Acute superficial gastritis without hemorrhage     Discharge Instructions     Avoid NSAIDs Use Tylenol for pain and/or fever Maintain oral fluids.   ED Prescriptions    Medication Sig Dispense Auth. Provider   pantoprazole (PROTONIX) 20 MG tablet Take 1 tablet (20 mg total) by mouth daily. 30 tablet Alura Olveda, Britta Mccreedy, MD     PDMP not reviewed this encounter.   Merrilee Jansky, MD 06/05/20 1000

## 2020-06-05 NOTE — Discharge Instructions (Signed)
Avoid NSAIDs Use Tylenol for pain and/or fever Maintain oral fluids.

## 2020-07-06 ENCOUNTER — Ambulatory Visit (INDEPENDENT_AMBULATORY_CARE_PROVIDER_SITE_OTHER): Payer: Medicaid Other

## 2020-07-06 ENCOUNTER — Encounter (HOSPITAL_COMMUNITY): Payer: Self-pay | Admitting: *Deleted

## 2020-07-06 ENCOUNTER — Other Ambulatory Visit: Payer: Self-pay

## 2020-07-06 ENCOUNTER — Ambulatory Visit (HOSPITAL_COMMUNITY)
Admission: EM | Admit: 2020-07-06 | Discharge: 2020-07-06 | Disposition: A | Discharge status: Home / Self Care | Payer: Medicaid Other | Attending: Urgent Care | Admitting: Urgent Care

## 2020-07-06 DIAGNOSIS — M25472 Effusion, left ankle: Secondary | ICD-10-CM

## 2020-07-06 DIAGNOSIS — M25572 Pain in left ankle and joints of left foot: Secondary | ICD-10-CM | POA: Diagnosis not present

## 2020-07-06 MED ORDER — NAPROXEN 500 MG PO TABS: 500.0000 mg | ORAL_TABLET | Freq: Two times a day (BID) | ORAL | 0 refills | Status: AC

## 2020-07-06 NOTE — ED Triage Notes (Addendum)
Patient in with left ankle pain x 3 weeks. Patient denies any recent injury. Patient states that she works at [employer] and does a lot of standing and walking. Swelling noted to left ankle. Rates pain at 10/10. Limited ROM.

## 2020-07-06 NOTE — ED Provider Notes (Signed)
[facility]   MRN: [id number] DOB: 07-30-1976  Subjective:   [name] is a 44 y.o. female presenting for 3-week history of persistent and worsening left-sided ankle pain with swelling. Patient states that she cannot recall any particular trauma. However, she does work at [employer] where she is full-time and does a lot of standing, walking. Denies history of gout, arthritis.  No current facility-administered medications for this encounter.  Current Outpatient Medications:    pantoprazole (PROTONIX) 20 MG tablet, Take 1 tablet (20 mg total) by mouth daily., Disp: 30 tablet, Rfl: 0   No Known Allergies  History reviewed. No pertinent past medical history.   Past Surgical History:  Procedure Laterality Date   KIDNEY STONE SURGERY      Family History  Problem Relation Age of Onset   Seizures Mother    Seizures Father     Social History   Tobacco Use   Smoking status: Former Smoker   Smokeless tobacco: Never Used  Substance Use Topics   Alcohol use: No   Drug use: No    ROS   Objective:   Vitals: BP 136/86 (BP Location: Left Arm)    Pulse 71    Temp 97.9 F (36.6 C) (Oral)    Resp 16    LMP 06/17/2020    SpO2 100%   Physical Exam Constitutional:      General: She is not in acute distress.    Appearance: Normal appearance. She is well-developed. She is not ill-appearing, toxic-appearing or diaphoretic.  HENT:     Head: Normocephalic and atraumatic.     Nose: Nose normal.     Mouth/Throat:     Mouth: Mucous membranes are moist.     Pharynx: Oropharynx is clear.  Eyes:     General: No scleral icterus.       Right eye: No discharge.        Left eye: No discharge.     Extraocular Movements: Extraocular movements intact.     Conjunctiva/sclera: Conjunctivae normal.     Pupils: Pupils are equal, round, and reactive to light.  Cardiovascular:     Rate and Rhythm: Normal rate.  Pulmonary:     Effort: Pulmonary effort is normal.   Musculoskeletal:     Left ankle: Swelling present. No deformity, ecchymosis or lacerations. Tenderness present over the medial malleolus. No lateral malleolus, ATF ligament, AITF ligament or CF ligament tenderness. Decreased range of motion.     Left Achilles Tendon: No tenderness or defects. Thompson's test negative.  Skin:    General: Skin is warm and dry.  Neurological:     General: No focal deficit present.     Mental Status: She is alert and oriented to person, place, and time.  Psychiatric:        Mood and Affect: Mood normal.        Behavior: Behavior normal.        Thought Content: Thought content normal.        Judgment: Judgment normal.    DG Ankle Complete Left  Result Date: 07/06/2020 CLINICAL DATA:  Left ankle pain EXAM: LEFT ANKLE COMPLETE - 3+ VIEW COMPARISON:  None. FINDINGS: No acute fracture or dislocation. No aggressive osseous lesion. Normal alignment. Small plantar calcaneal spur. Mild soft tissue swelling around the ankle. No radiopaque foreign body or soft tissue emphysema. IMPRESSION: No acute osseous injury of the left ankle. Electronically Signed   By: Elige Ko   On: 07/06/2020  10:33   Assessment and Plan :   PDMP not reviewed this encounter.  1. Acute left ankle pain   2. Left ankle swelling     Suspect inflammatory process related to the nature of her work, weight burden.  Recommended conservative management with RICE method.  I used a 3 inch Ace wrap to wrap her left ankle in figure 8 method.  Recommended icing after each shift.  Note for work provided for the next 3 days. Counseled patient on potential for adverse effects with medications prescribed/recommended today, ER and return-to-clinic precautions discussed, patient verbalized understanding.    Wallis Bamberg, PA-C 07/06/20 1108

## 2020-07-25 ENCOUNTER — Encounter (HOSPITAL_COMMUNITY): Payer: Self-pay | Admitting: *Deleted

## 2020-07-25 ENCOUNTER — Ambulatory Visit (HOSPITAL_COMMUNITY)
Admission: EM | Admit: 2020-07-25 | Discharge: 2020-07-25 | Disposition: A | Discharge status: Home / Self Care | Payer: Medicaid Other

## 2020-07-25 ENCOUNTER — Other Ambulatory Visit: Payer: Self-pay

## 2020-07-25 DIAGNOSIS — M25572 Pain in left ankle and joints of left foot: Secondary | ICD-10-CM

## 2020-07-25 DIAGNOSIS — M25472 Effusion, left ankle: Secondary | ICD-10-CM | POA: Diagnosis not present

## 2020-07-25 HISTORY — DX: Calculus of kidney: N20.0

## 2020-07-25 NOTE — ED Triage Notes (Signed)
Pt reports being seen in [facility] 07/06/20 for left ankle pain.  States took Rx NSAID, but no improvement.  Continues with left ankle swelling and pain.  LLE CMS intact.

## 2020-07-25 NOTE — ED Provider Notes (Signed)
[facility]    CSN: [id number] Arrival date & time: 07/25/20  1252      History   Chief Complaint Chief Complaint  Patient presents with  . Ankle Pain    HPI [name] is a 44 y.o. female.   Patient presents with ongoing pain and swelling of her left ankle x 5-6 weeks.  No falls or injury.  She was seen here on 07/06/2020 by PA [facility]; diagnosed with left ankle pain and swelling; x-ray normal; treated with rest, ice, Ace wrap, elevation, and naproxen.  Patient states she is finished taking the naproxen and her symptoms have not improved.  She denies fever, chills, numbness, paresthesias, weakness, lesions, rash, erythema, ecchymosis, or other symptoms.  The history is provided by the patient and medical records.    Past Medical History:  Diagnosis Date  . Kidney stone     Patient Active Problem List   Diagnosis Date Noted  . 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 KIDNEY STONE SURGERY    . TUBAL LIGATION      OB History   No obstetric history on file.      Home Medications    Prior to Admission medications   Medication Sig Start Date End Date Taking? Authorizing Provider  naproxen (NAPROSYN) 500 MG tablet Take 1 tablet (500 mg total) by mouth 2 (two) times daily with a meal. 07/06/20   Wallis Bamberg, PA-C  pantoprazole (PROTONIX) 20 MG tablet Take 1 tablet (20 mg total) by mouth daily. 06/05/20   LampteyBritta Mccreedy, MD    Family History Family History  Problem Relation Age of Onset  . Seizures Mother   . Seizures Father     Social History Social History   Tobacco Use  . Smoking status: Former Games developer  . Smokeless tobacco: Never Used  Substance Use Topics  . Alcohol use: No  . Drug use: No     Allergies   Patient has no known allergies.   Review of Systems Review of Systems  Constitutional: Negative for chills and fever.  HENT: Negative for ear pain and  sore throat.   Eyes: Negative for pain and visual disturbance.  Respiratory: Negative for cough and shortness of breath.   Cardiovascular: Negative for chest pain and palpitations.  Gastrointestinal: Negative for abdominal pain and vomiting.  Genitourinary: Negative for dysuria and hematuria.  Musculoskeletal: Positive for arthralgias and joint swelling. Negative for back pain.  Skin: Negative for color change and rash.  Neurological: Negative for seizures, syncope, weakness and numbness.  All other systems reviewed and are negative.    Physical Exam Triage Vital Signs ED Triage Vitals [07/25/20 1319]  Enc Vitals Group     BP (!) 145/79     Pulse Rate 84     Resp 16     Temp 98.9 F (37.2 C)     Temp Source Oral     SpO2 98 %     Weight      Height      Head Circumference      Peak Flow      Pain Score 9     Pain Loc      Pain Edu?      Excl. in GC?    No data found.  Updated Vital Signs BP (!) 145/79   Pulse 84   Temp 98.9 F (37.2 C) (Oral)   Resp 16  LMP 07/21/2020 (Exact Date)   SpO2 98%   Visual Acuity Right Eye Distance:   Left Eye Distance:   Bilateral Distance:    Right Eye Near:   Left Eye Near:    Bilateral Near:     Physical Exam Vitals and nursing note reviewed.  Constitutional:      General: She is not in acute distress.    Appearance: She is well-developed.  HENT:     Head: Normocephalic and atraumatic.     Mouth/Throat:     Mouth: Mucous membranes are moist.  Eyes:     Conjunctiva/sclera: Conjunctivae normal.  Cardiovascular:     Rate and Rhythm: Normal rate and regular rhythm.     Heart sounds: No murmur heard.   Pulmonary:     Effort: Pulmonary effort is normal. No respiratory distress.     Breath sounds: Normal breath sounds.  Abdominal:     Palpations: Abdomen is soft.     Tenderness: There is no abdominal tenderness.  Musculoskeletal:        General: Swelling and tenderness present. No deformity or signs of injury.      Cervical back: Neck supple.     Comments: Left ankle: edematous, tender at generalized medial ankle. No erythema or ecchymosis. No lesions.   Skin:    General: Skin is warm and dry.     Findings: No bruising, erythema, lesion or rash.  Neurological:     General: No focal deficit present.     Mental Status: She is alert and oriented to person, place, and time.     Sensory: No sensory deficit.     Motor: No weakness.     Gait: Gait normal.  Psychiatric:        Mood and Affect: Mood normal.        Behavior: Behavior normal.      UC Treatments / Results  Labs (all labs ordered are listed, but only abnormal results are displayed) Labs Reviewed - No data to display  EKG   Radiology No results found.  Procedures Procedures (including critical care time)  Medications Ordered in UC Medications - No data to display  Initial Impression / Assessment and Plan / UC Course  I have reviewed the triage vital signs and the nursing notes.  Pertinent labs & imaging results that were available during my care of the patient were reviewed by me and considered in my medical decision making (see chart for details).    Left ankle pain and swelling.  Instructed patient to continue rest, ice packs, elevation, Ace wrap.  Discussed that she needs to follow-up with an orthopedist soon as possible for evaluation of her ongoing symptoms.  Patient agrees to plan of care.   Final Clinical Impressions(s) / UC Diagnoses   Final diagnoses:  Acute left ankle pain  Left ankle swelling     Discharge Instructions     Continue to rest your ankle.  Keep it elevated as often as possible.  Apply ice packs as directed.  Wear the Ace wrap.    Follow-up with an orthopedist such as the one listed below.      ED Prescriptions    None     PDMP not reviewed this encounter.   Mickie Bail, NP 07/25/20 1435

## 2020-07-25 NOTE — Discharge Instructions (Signed)
Continue to rest your ankle.  Keep it elevated as often as possible.  Apply ice packs as directed.  Wear the Ace wrap.    Follow-up with an orthopedist such as the one listed below.

## 2021-01-08 ENCOUNTER — Other Ambulatory Visit: Payer: Self-pay | Admitting: Physician Assistant

## 2021-01-08 DIAGNOSIS — Z1231 Encounter for screening mammogram for malignant neoplasm of breast: Secondary | ICD-10-CM

## 2021-01-13 ENCOUNTER — Ambulatory Visit
Admission: RE | Admit: 2021-01-13 | Discharge: 2021-01-13 | Disposition: A | Discharge status: Home / Self Care | Payer: Medicaid Other | Source: Ambulatory Visit | Attending: Physician Assistant | Admitting: Physician Assistant

## 2021-01-13 ENCOUNTER — Other Ambulatory Visit: Payer: Self-pay

## 2021-01-13 DIAGNOSIS — Z1231 Encounter for screening mammogram for malignant neoplasm of breast: Secondary | ICD-10-CM

## 2021-01-14 ENCOUNTER — Other Ambulatory Visit: Payer: Self-pay | Admitting: Physician Assistant

## 2021-01-14 DIAGNOSIS — R928 Other abnormal and inconclusive findings on diagnostic imaging of breast: Secondary | ICD-10-CM

## 2021-02-04 ENCOUNTER — Other Ambulatory Visit: Payer: Self-pay

## 2021-02-04 ENCOUNTER — Ambulatory Visit
Admission: RE | Admit: 2021-02-04 | Discharge: 2021-02-04 | Disposition: A | Discharge status: Home / Self Care | Payer: Medicaid Other | Source: Ambulatory Visit | Attending: Physician Assistant | Admitting: Physician Assistant

## 2021-02-04 DIAGNOSIS — R928 Other abnormal and inconclusive findings on diagnostic imaging of breast: Secondary | ICD-10-CM

## 2021-12-19 IMAGING — US US BREAST*L* LIMITED INC AXILLA
1 series · 4 of 4 positions shown · non-contrast
Comparison: Previous exam(s).

CLINICAL DATA: Patient recalled from screening for right breast
masses and possible left breast distortion.

EXAM:
DIGITAL DIAGNOSTIC BILATERAL MAMMOGRAM WITH TOMOSYNTHESIS AND CAD;
ULTRASOUND LEFT BREAST LIMITED; ULTRASOUND RIGHT BREAST LIMITED
TECHNIQUE: Bilateral digital diagnostic mammography and breast tomosynthesis
was performed. The images were evaluated with computer-aided
detection.; Targeted ultrasound examination of the left breast was
performed; Targeted ultrasound examination of the right breast was
performed

[Series 1: us breast*left* limited inc axilla · 0.06mm/px · 4 of 4 slices shown]
[im 1/4]
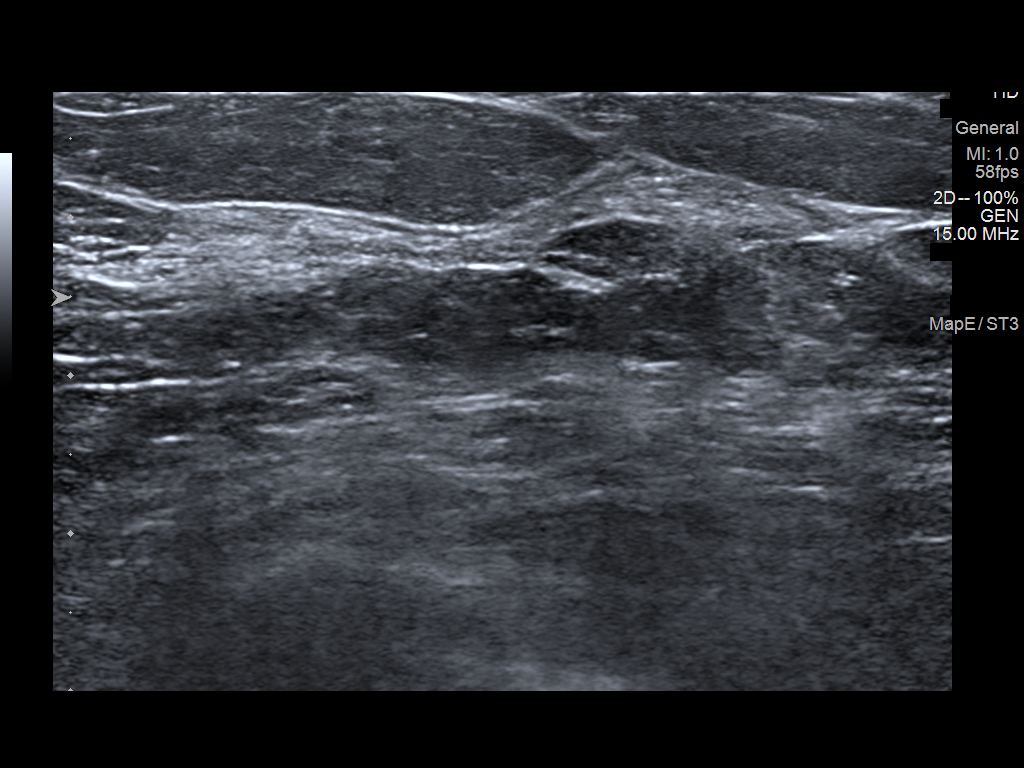
[im 2/4]
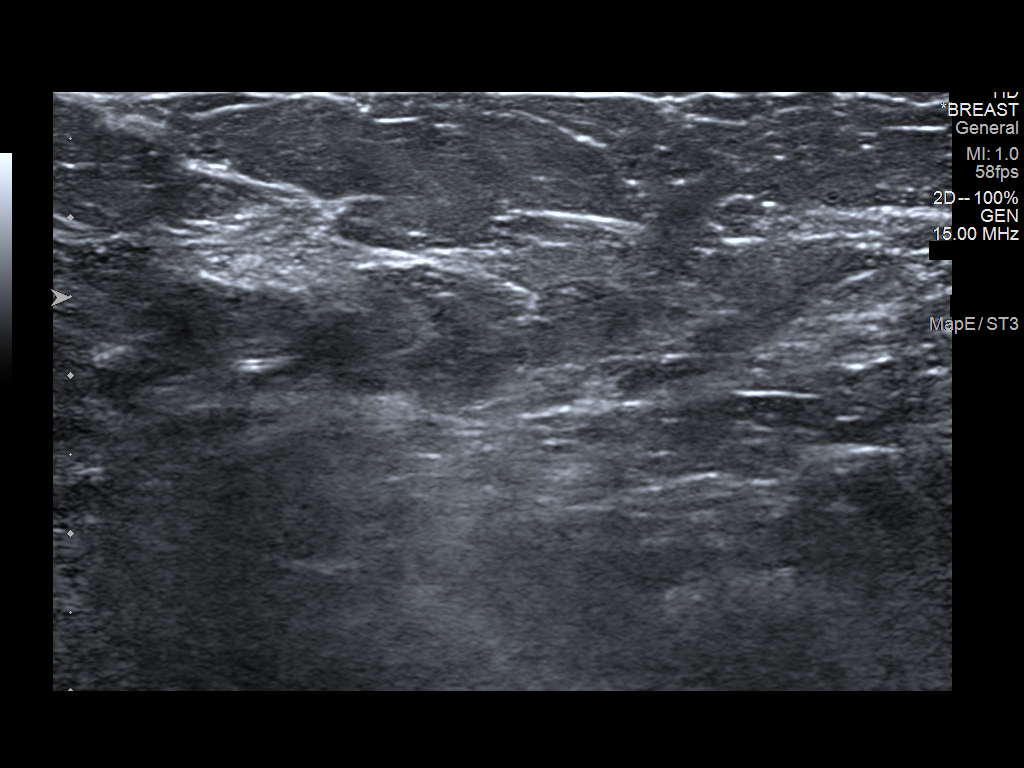
[im 3/4]
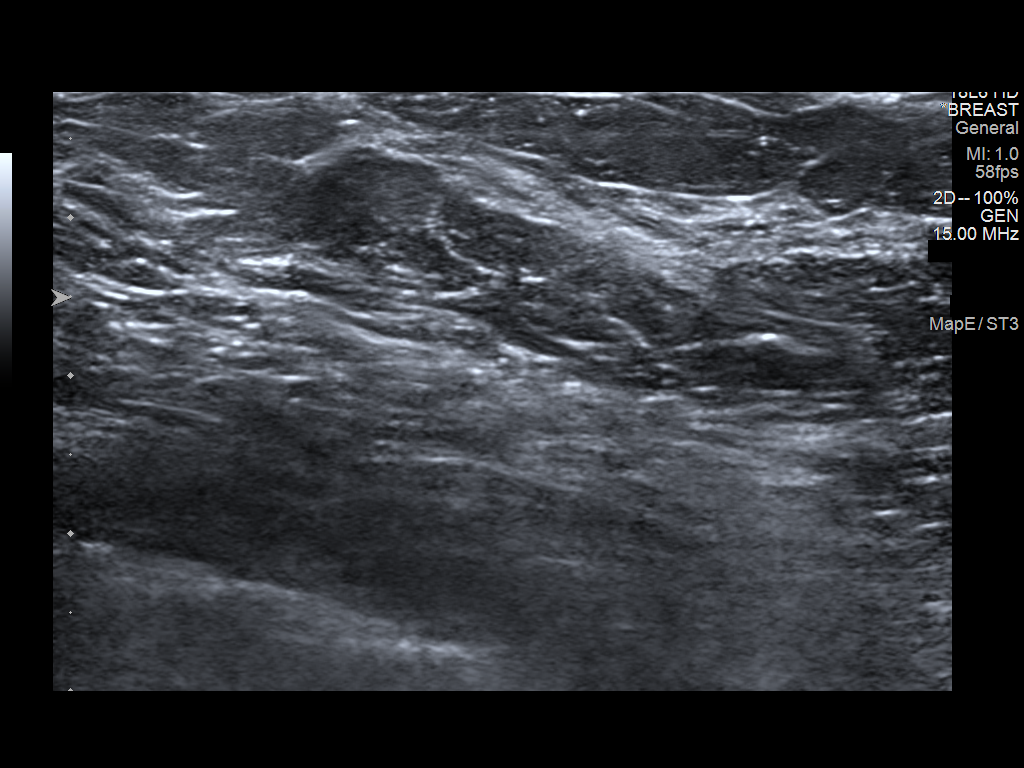
[im 4/4]
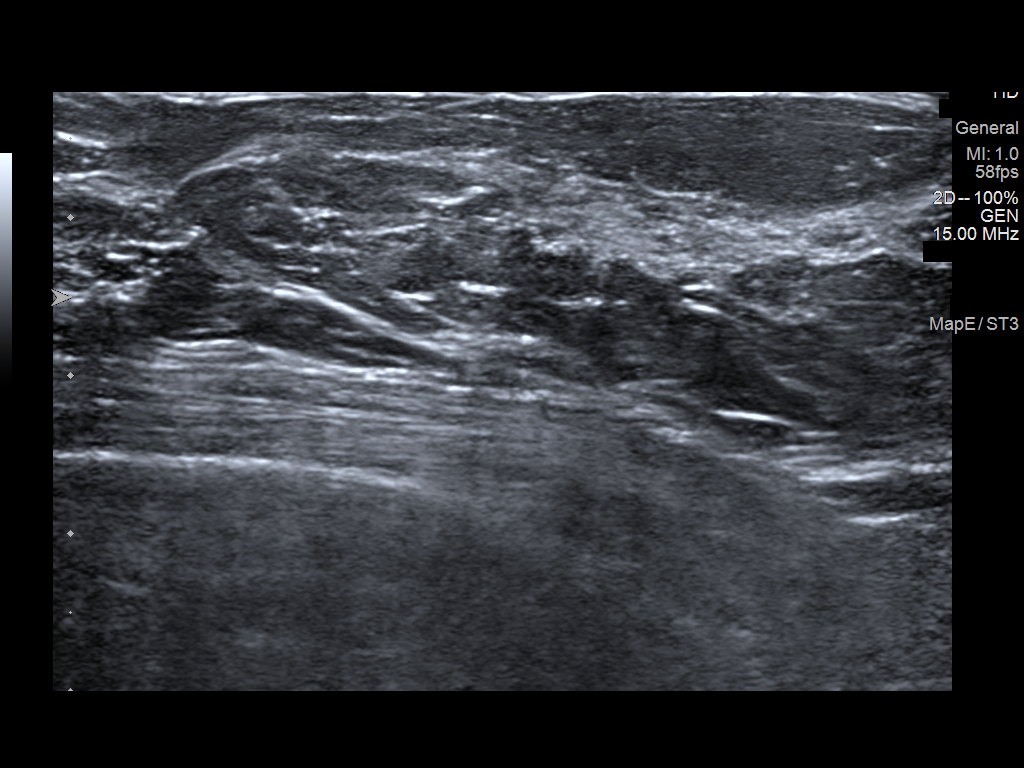

[4 of 4 positions shown; findings below may reference images not displayed]

ACR Breast Density Category c: The breast tissue is heterogeneously
dense, which may obscure small masses.
FINDINGS: There are 2 persistent oval circumscribed masses within the
upper-outer right breast.

Questioned distortion within the left breast resolved with
additional imaging compatible with dense overlapping fibroglandular
tissue.

Targeted ultrasound is performed, showing two adjacent intramammary
lymph nodes right breast 9 o'clock position 4 cm from the nipple and
9:30 o'clock 10 cm from the nipple.
IMPRESSION: No mammographic evidence for malignancy.

RECOMMENDATION:
Screening mammogram in one year.(Code:6N-A-1P7)

I have discussed the findings and recommendations with the patient.
If applicable, a reminder letter will be sent to the patient
regarding the next appointment.

BI-RADS CATEGORY  2: Benign.

## 2021-12-19 IMAGING — US US BREAST*R* LIMITED INC AXILLA
1 series · 10 of 10 positions shown · non-contrast
Comparison: Previous exam(s).

CLINICAL DATA: Patient recalled from screening for right breast
masses and possible left breast distortion.

EXAM:
DIGITAL DIAGNOSTIC BILATERAL MAMMOGRAM WITH TOMOSYNTHESIS AND CAD;
ULTRASOUND LEFT BREAST LIMITED; ULTRASOUND RIGHT BREAST LIMITED
TECHNIQUE: Bilateral digital diagnostic mammography and breast tomosynthesis
was performed. The images were evaluated with computer-aided
detection.; Targeted ultrasound examination of the left breast was
performed; Targeted ultrasound examination of the right breast was
performed

[Series 1: us breast*right* limited inc axilla · 0.06mm/px · 10 of 10 slices shown]
[im 1/10]
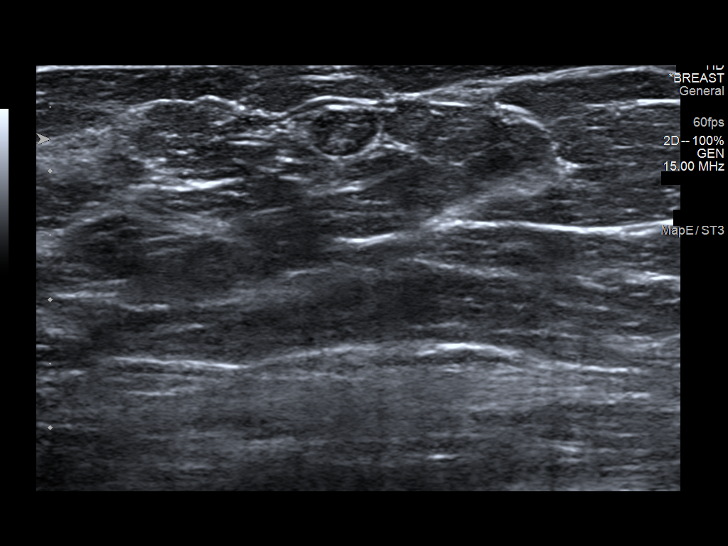
[im 2/10]
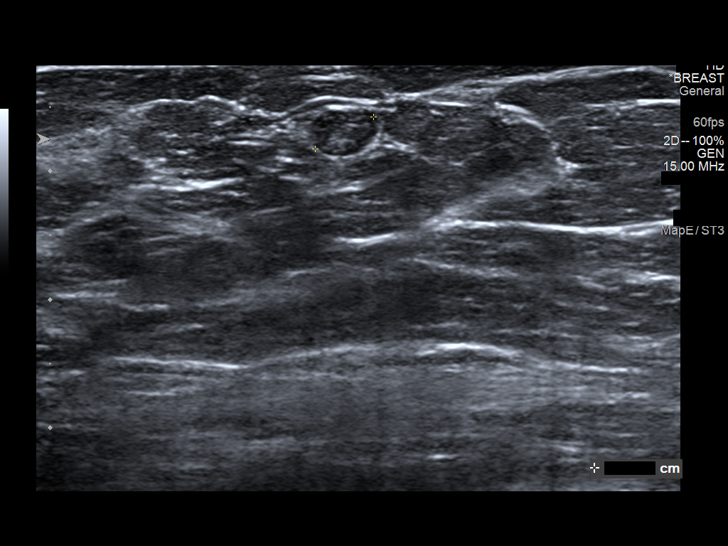
[im 3/10]
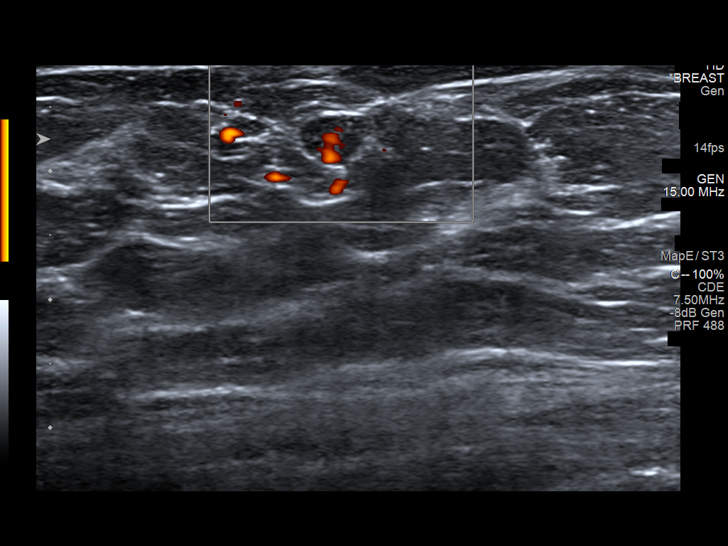
[im 4/10]
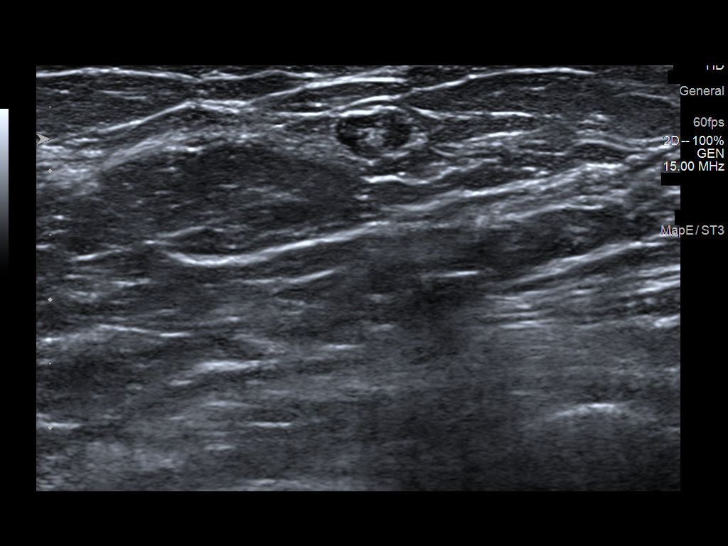
[im 5/10]
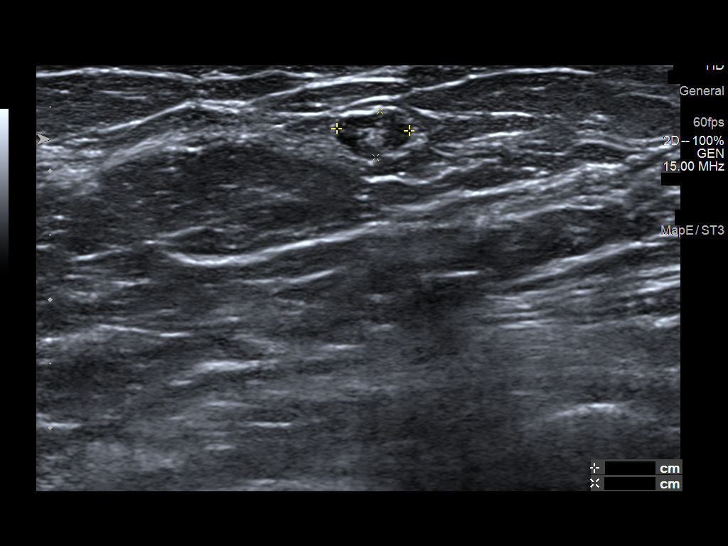
[im 6/10]
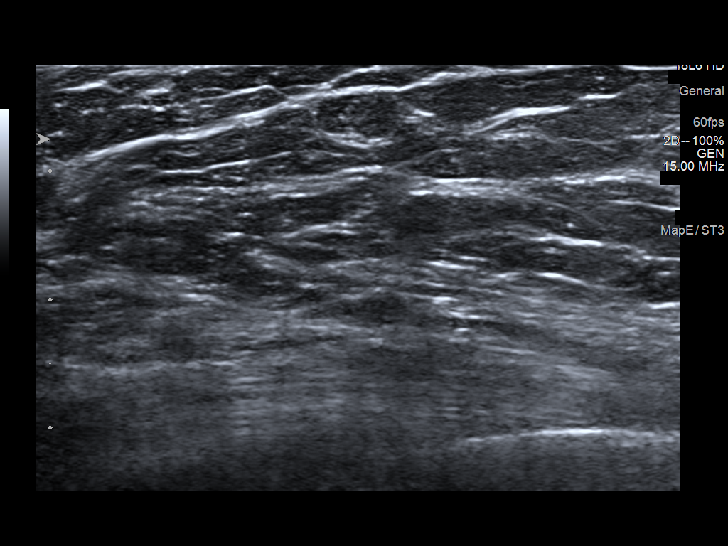
[im 7/10]
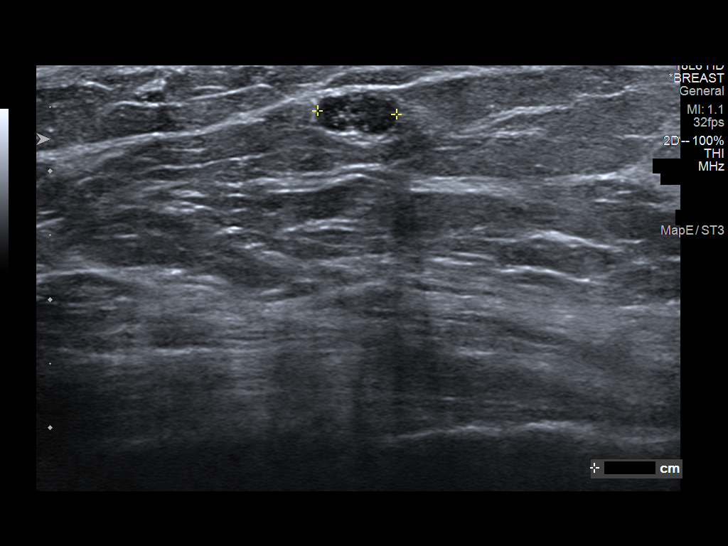
[im 8/10]
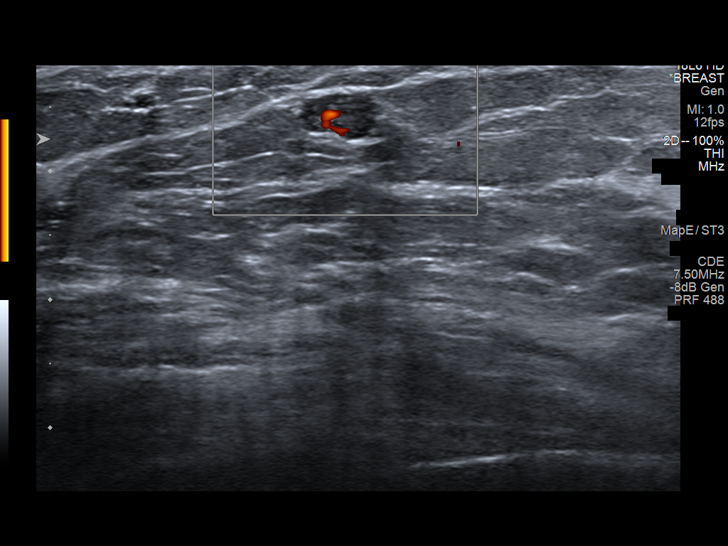
[im 9/10]
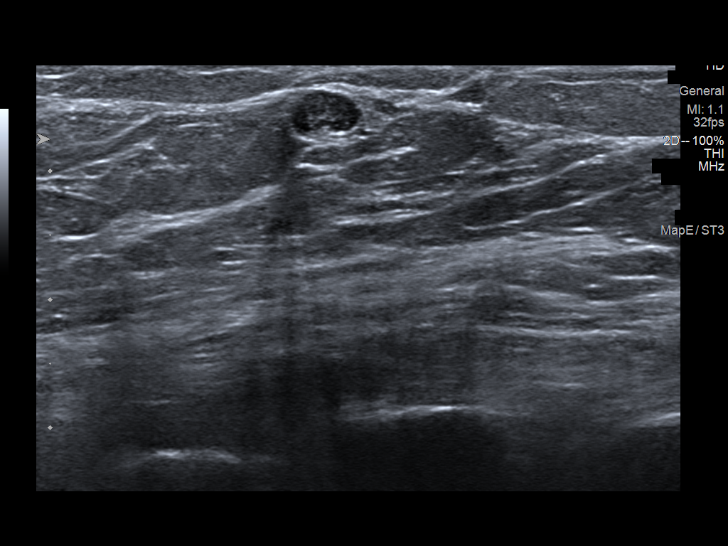
[im 10/10]
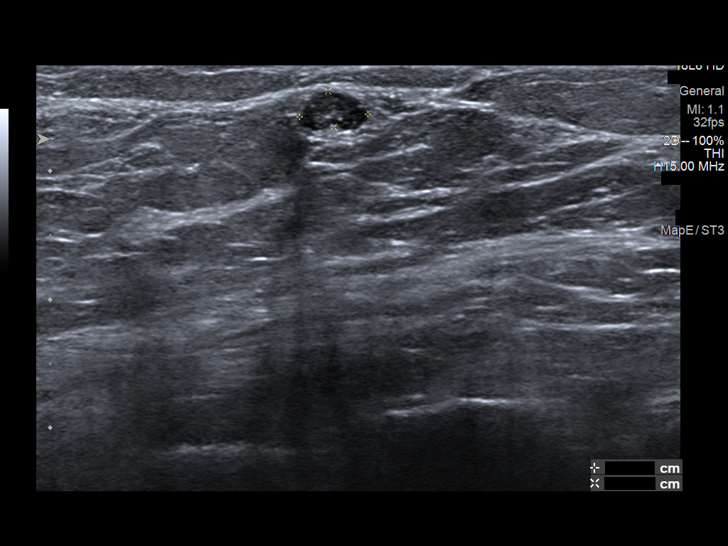

[10 of 10 positions shown; findings below may reference images not displayed]

ACR Breast Density Category c: The breast tissue is heterogeneously
dense, which may obscure small masses.
FINDINGS: There are 2 persistent oval circumscribed masses within the
upper-outer right breast.

Questioned distortion within the left breast resolved with
additional imaging compatible with dense overlapping fibroglandular
tissue.

Targeted ultrasound is performed, showing two adjacent intramammary
lymph nodes right breast 9 o'clock position 4 cm from the nipple and
9:30 o'clock 10 cm from the nipple.
IMPRESSION: No mammographic evidence for malignancy.

RECOMMENDATION:
Screening mammogram in one year.(Code:6N-A-1P7)

I have discussed the findings and recommendations with the patient.
If applicable, a reminder letter will be sent to the patient
regarding the next appointment.

BI-RADS CATEGORY  2: Benign.

## 2021-12-31 ENCOUNTER — Emergency Department (HOSPITAL_COMMUNITY): Payer: No Typology Code available for payment source

## 2021-12-31 ENCOUNTER — Encounter (HOSPITAL_COMMUNITY): Payer: Self-pay

## 2021-12-31 ENCOUNTER — Other Ambulatory Visit: Payer: Self-pay

## 2021-12-31 ENCOUNTER — Emergency Department (HOSPITAL_COMMUNITY)
Admission: EM | Admit: 2021-12-31 | Discharge: 2022-01-01 | Disposition: A | Discharge status: Home / Self Care | Payer: No Typology Code available for payment source | Attending: Emergency Medicine | Admitting: Emergency Medicine

## 2021-12-31 DIAGNOSIS — S139XXA Sprain of joints and ligaments of unspecified parts of neck, initial encounter: Secondary | ICD-10-CM

## 2021-12-31 DIAGNOSIS — Y92410 Unspecified street and highway as the place of occurrence of the external cause: Secondary | ICD-10-CM | POA: Diagnosis not present

## 2021-12-31 DIAGNOSIS — S199XXA Unspecified injury of neck, initial encounter: Secondary | ICD-10-CM | POA: Diagnosis present

## 2021-12-31 DIAGNOSIS — S134XXA Sprain of ligaments of cervical spine, initial encounter: Secondary | ICD-10-CM | POA: Insufficient documentation

## 2021-12-31 DIAGNOSIS — S0990XA Unspecified injury of head, initial encounter: Secondary | ICD-10-CM | POA: Insufficient documentation

## 2021-12-31 MED ORDER — HYDROCODONE-ACETAMINOPHEN 5-325 MG PO TABS
1.0000 | ORAL_TABLET | Freq: Once | ORAL | Status: AC
  Administered 2021-12-31: 1 via ORAL
  Filled 2021-12-31: qty 1
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
## 2021-12-31 NOTE — ED Notes (Signed)
Patient transported to X-ray 
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
## 2022-01-01 ENCOUNTER — Emergency Department (HOSPITAL_COMMUNITY): Payer: No Typology Code available for payment source

## 2022-01-01 DIAGNOSIS — S134XXA Sprain of ligaments of cervical spine, initial encounter: Secondary | ICD-10-CM | POA: Diagnosis not present

## 2022-01-01 MED ORDER — IBUPROFEN 400 MG PO TABS
400.0000 mg | ORAL_TABLET | Freq: Once | ORAL | Status: AC
  Administered 2022-01-01: 400 mg via ORAL
  Filled 2022-01-01: qty 1

## 2022-01-01 NOTE — Discharge Instructions (Signed)
Apply ice for 30 minutes at a time, 4 times a day. ? ?Take either ibuprofen or naproxen as needed for pain.  To get additional pain relief, add acetaminophen.  Please be aware that when you combine acetaminophen with either ibuprofen or naproxen, you get better pain relief that he would get from taking either medication by itself. ?

## 2022-01-01 NOTE — ED Notes (Signed)
Discharge instructions reviewed with patient and family. Patient and family verbalized understanding of instructions. Follow-up care and medications were reviewed. Patient ambulatory with steady gait. VSS upon discharge.  °
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
## 2022-12-30 ENCOUNTER — Other Ambulatory Visit: Payer: Self-pay | Admitting: Nurse Practitioner

## 2022-12-30 DIAGNOSIS — R3129 Other microscopic hematuria: Secondary | ICD-10-CM

## 2023-01-26 ENCOUNTER — Ambulatory Visit
Admission: RE | Admit: 2023-01-26 | Discharge: 2023-01-26 | Disposition: A | Discharge status: Home / Self Care | Payer: Medicaid Other | Source: Ambulatory Visit | Attending: Nurse Practitioner | Admitting: Nurse Practitioner

## 2023-01-26 DIAGNOSIS — R3129 Other microscopic hematuria: Secondary | ICD-10-CM

## 2023-03-21 ENCOUNTER — Other Ambulatory Visit: Payer: Self-pay

## 2023-03-21 ENCOUNTER — Emergency Department (HOSPITAL_COMMUNITY)
Admission: EM | Admit: 2023-03-21 | Discharge: 2023-03-21 | Disposition: A | Discharge status: Home / Self Care | Payer: Medicaid Other | Attending: Emergency Medicine | Admitting: Emergency Medicine

## 2023-03-21 ENCOUNTER — Encounter (HOSPITAL_COMMUNITY): Payer: Self-pay

## 2023-03-21 DIAGNOSIS — M545 Low back pain, unspecified: Secondary | ICD-10-CM | POA: Diagnosis present

## 2023-03-21 DIAGNOSIS — M5441 Lumbago with sciatica, right side: Secondary | ICD-10-CM | POA: Diagnosis not present

## 2023-03-21 MED ORDER — OXYCODONE-ACETAMINOPHEN 5-325 MG PO TABS
1.0000 | ORAL_TABLET | Freq: Once | ORAL | Status: AC
  Administered 2023-03-21: 1 via ORAL
  Filled 2023-03-21: qty 1

## 2023-03-21 MED ORDER — METHOCARBAMOL 500 MG PO TABS
500.0000 mg | ORAL_TABLET | Freq: Once | ORAL | Status: AC
  Administered 2023-03-21: 500 mg via ORAL
  Filled 2023-03-21: qty 1

## 2023-03-21 MED ORDER — PREDNISONE 10 MG (21) PO TBPK: ORAL_TABLET | Freq: Every day | ORAL | 0 refills | Status: DC

## 2023-03-21 MED ORDER — METHOCARBAMOL 500 MG PO TABS: 1000.0000 mg | ORAL_TABLET | Freq: Four times a day (QID) | ORAL | 0 refills | Status: DC | PRN

## 2023-03-21 MED ORDER — KETOROLAC TROMETHAMINE 60 MG/2ML IM SOLN
30.0000 mg | Freq: Once | INTRAMUSCULAR | Status: AC
  Administered 2023-03-21: 30 mg via INTRAMUSCULAR
  Filled 2023-03-21: qty 2

## 2023-03-21 MED ORDER — OXYCODONE-ACETAMINOPHEN 5-325 MG PO TABS: 1.0000 | ORAL_TABLET | Freq: Three times a day (TID) | ORAL | 0 refills | Status: AC | PRN

## 2023-03-21 MED ORDER — LIDOCAINE 4 % EX PTCH: 1.0000 | MEDICATED_PATCH | CUTANEOUS | 0 refills | Status: AC

## 2023-03-21 MED ORDER — LIDOCAINE 5 % EX PTCH
1.0000 | MEDICATED_PATCH | CUTANEOUS | Status: DC
  Administered 2023-03-21: 1 via TRANSDERMAL
  Filled 2023-03-21: qty 1

## 2023-03-21 NOTE — ED Provider Notes (Signed)
[place] EMERGENCY DEPARTMENT AT [facility] Provider Note   CSN: [id number] Arrival date & time: 03/21/23  1033     History  Chief Complaint  Patient presents with   Back Pain   Leg Injury    [name] is a 47 y.o. female.  HPI Patient presents for low back pain.  She has no known chronic medical conditions.  4 days ago, she was lifting up her father.  While doing this lift, she experienced a pulling sensation in her right lower back.  Since that time, she has had pain in right lower back that radiates down the posterior aspect of her right leg.  Pain is worsened with movements.  Patient has been taking ibuprofen and Robaxin for symptomatic relief.  Pain has been persistent.  Last dose of pain medication was 4 AM this morning.     Home Medications Prior to Admission medications   Medication Sig Start Date End Date Taking? Authorizing Provider  lidocaine 4 % Place 1 patch onto the skin daily. 03/21/23  Yes Gloris Manchester, MD  methocarbamol (ROBAXIN) 500 MG tablet Take 2 tablets (1,000 mg total) by mouth every 6 (six) hours as needed for muscle spasms. 03/21/23  Yes Gloris Manchester, MD  oxyCODONE-acetaminophen (PERCOCET/ROXICET) 5-325 MG tablet Take 1 tablet by mouth every 8 (eight) hours as needed for up to 3 days for severe pain. 03/21/23 03/24/23 Yes Gloris Manchester, MD  predniSONE (STERAPRED UNI-PAK 21 TAB) 10 MG (21) TBPK tablet Take by mouth daily. Take 6 tabs by mouth daily  for 2 days, then 5 tabs for 2 days, then 4 tabs for 2 days, then 3 tabs for 2 days, 2 tabs for 2 days, then 1 tab by mouth daily for 2 days 03/21/23  Yes Gloris Manchester, MD  naproxen (NAPROSYN) 500 MG tablet Take 1 tablet (500 mg total) by mouth 2 (two) times daily with a meal. 07/06/20   Wallis Bamberg, PA-C  pantoprazole (PROTONIX) 20 MG tablet Take 1 tablet (20 mg total) by mouth daily. 06/05/20   Lamptey, Britta Mccreedy, MD      Allergies    Patient has no known allergies.    Review of Systems   Review of  Systems  Musculoskeletal:  Positive for back pain.  All other systems reviewed and are negative.   Physical Exam Updated Vital Signs BP 132/85   Pulse 80   Temp 97.8 F (36.6 C) (Oral)   Resp 18   SpO2 100%  Physical Exam Vitals and nursing note reviewed.  Constitutional:      General: She is not in acute distress.    Appearance: Normal appearance. She is well-developed. She is not ill-appearing, toxic-appearing or diaphoretic.  HENT:     Head: Normocephalic and atraumatic.     Right Ear: External ear normal.     Left Ear: External ear normal.     Nose: Nose normal.     Mouth/Throat:     Mouth: Mucous membranes are moist.  Eyes:     Extraocular Movements: Extraocular movements intact.     Conjunctiva/sclera: Conjunctivae normal.  Cardiovascular:     Rate and Rhythm: Normal rate and regular rhythm.  Pulmonary:     Effort: Pulmonary effort is normal. No respiratory distress.  Abdominal:     General: There is no distension.     Palpations: Abdomen is soft.  Musculoskeletal:        General: No swelling or deformity. Normal range of motion.  Cervical back: Normal range of motion and neck supple.     Right lower leg: No edema.     Left lower leg: No edema.  Skin:    General: Skin is warm and dry.     Capillary Refill: Capillary refill takes less than 2 seconds.     Coloration: Skin is not jaundiced or pale.  Neurological:     General: No focal deficit present.     Mental Status: She is alert and oriented to person, place, and time.     Sensory: No sensory deficit.     Motor: No weakness.  Psychiatric:        Mood and Affect: Mood normal.        Behavior: Behavior normal.        Thought Content: Thought content normal.        Judgment: Judgment normal.     ED Results / Procedures / Treatments   Labs (all labs ordered are listed, but only abnormal results are displayed) Labs Reviewed - No data to display  EKG None  Radiology No results  found.  Procedures Procedures    Medications Ordered in ED Medications  lidocaine (LIDODERM) 5 % 1 patch (1 patch Transdermal Patch Applied 03/21/23 1135)  methocarbamol (ROBAXIN) tablet 500 mg (has no administration in time range)  ketorolac (TORADOL) injection 30 mg (has no administration in time range)  methocarbamol (ROBAXIN) tablet 500 mg (500 mg Oral Given 03/21/23 1136)  oxyCODONE-acetaminophen (PERCOCET/ROXICET) 5-325 MG per tablet 1 tablet (1 tablet Oral Given 03/21/23 1136)    ED Course/ Medical Decision Making/ A&P                             Medical Decision Making Risk OTC drugs. Prescription drug management.   Patient presents for right-sided low back pain over the past 4 days.  Onset of pain was while lifting her elderly father.  Pain radiates down posterior right leg.  On arrival in the ED, vital signs are normal.  Patient is well-appearing on exam.  She has no neurologic deficits on lower extremities.  Tenderness is present in right lower back.  Ipsilateral straight leg raise test is positive.  Symptoms are consistent with low back pain with sciatica.  No red flag symptoms are present.  Multimodal pain control was given in the ED.  Patient had improved symptoms.  She was advised to continue multimodal pain control at home and to follow-up with sports medicine as needed.  She was discharged in good condition.        Final Clinical Impression(s) / ED Diagnoses Final diagnoses:  Acute right-sided low back pain with right-sided sciatica    Rx / DC Orders ED Discharge Orders          Ordered    methocarbamol (ROBAXIN) 500 MG tablet  Every 6 hours PRN        03/21/23 1326    lidocaine 4 %  Every 24 hours        03/21/23 1326    oxyCODONE-acetaminophen (PERCOCET/ROXICET) 5-325 MG tablet  Every 8 hours PRN        03/21/23 1326    predniSONE (STERAPRED UNI-PAK 21 TAB) 10 MG (21) TBPK tablet  Daily        03/21/23 1326              Gloris Manchester,  MD 03/21/23 1329

## 2023-03-21 NOTE — ED Triage Notes (Signed)
Pt c/o R lower back pain radiating down R leg x3 days.  Pain score 10/10.  Pt was able to walk to room w/o assistance.  Pt reports "I think, I picked up my son wrong."  Pt's PCP prescribed Ibuprofen 600mg  and Methocarbamol 500mg .  Pt reports taking both w/o relief.

## 2023-03-21 NOTE — Discharge Instructions (Addendum)
Continue ibuprofen every 6 hours.  Take methocarbamol every 6 hours as needed.  Lidocaine patches can be placed daily.  Avoid having more than 1 patch on your skin at any given time.  Percocet is a narcotic pain medication.  Take this only as needed.  Prednisone is a steroid to lower inflammation.  Take this in a tapering dose as prescribed.  Continue movement and stretching within the limits of your pain.  Follow-up with sports medicine as needed.  Telephone number is below.  Return to the emergency department for any new or worsening symptoms of concern.

## 2023-11-27 ENCOUNTER — Other Ambulatory Visit (INDEPENDENT_AMBULATORY_CARE_PROVIDER_SITE_OTHER): Payer: Self-pay

## 2023-11-27 ENCOUNTER — Encounter: Payer: Self-pay | Admitting: Physical Medicine and Rehabilitation

## 2023-11-27 ENCOUNTER — Ambulatory Visit (INDEPENDENT_AMBULATORY_CARE_PROVIDER_SITE_OTHER): Payer: Medicaid Other | Admitting: Physical Medicine and Rehabilitation

## 2023-11-27 VITALS — BP 161/92 | HR 78

## 2023-11-27 DIAGNOSIS — S39012A Strain of muscle, fascia and tendon of lower back, initial encounter: Secondary | ICD-10-CM

## 2023-11-27 DIAGNOSIS — M545 Low back pain, unspecified: Secondary | ICD-10-CM

## 2023-11-27 DIAGNOSIS — G8929 Other chronic pain: Secondary | ICD-10-CM | POA: Diagnosis not present

## 2023-11-27 MED ORDER — METHOCARBAMOL 500 MG PO TABS: 500.0000 mg | ORAL_TABLET | Freq: Three times a day (TID) | ORAL | 0 refills | Status: AC

## 2023-11-27 MED ORDER — PREDNISONE 50 MG PO TABS: 50.0000 mg | ORAL_TABLET | Freq: Every day | ORAL | 0 refills | Status: AC

## 2023-11-27 NOTE — Progress Notes (Unsigned)
 [name] - 48 y.o. female MRN [id number]  Date of birth: March 04, 1976  Office Visit Note: Visit Date: 11/27/2023 PCP: Patient, No Pcp Per Referred by: No ref. provider found  Subjective: Chief Complaint  Patient presents with   Lower Back - Pain   HPI: [name] is a 48 y.o. female who comes in today per the request of Aurelio Brash, NP for evaluation of acute on chronic bilateral lower back pain. Pain ongoing for over a year, states she strained for her lower back last year while caring for her father. Her pain increased several weeks ago. Her pain worsens when turning in bed. She describes pain as dull and sharp sensation, currently rates as 8 out of 10. She has tried home exercise regimen, rest and use of medications with minimal relief of pain. No relief of pain with Naproxen. She recently attended formal physical therapy at [facility], some relief of pain with these treatments. No recent imaging of lumbar spine. Patients mother passed last Thursday, she reports increased stress and anxiety. Patent denies focal weakness, numbness and tingling. No recent trauma or falls.        Review of Systems  Musculoskeletal:  Positive for back pain and myalgias.  Neurological:  Negative for tingling, sensory change, focal weakness and weakness.  All other systems reviewed and are negative.  Otherwise per HPI.  Assessment & Plan: Visit Diagnoses:    ICD-10-CM   1. Chronic bilateral low back pain without sciatica  M54.50 XR Lumbar Spine Complete   G89.29     2. Myofascial pain syndrome  M79.18        Plan: Findings:  Acute on chronic bilateral lower back pain. No radicular symptoms down the legs. Patient continues to have severe pain despite good conservative therapies such as formal physical therapy, home exercise regimen, rest and use of medications. Patients clinical presentation and exam are consistent with lumbar myofascial strain. There is myofascial  tenderness to bilateral lumbar paraspinal regions upon palpation today. I informed her myofascial strain injuries can take 6-8 weeks to heal. I obtained lumbar radiographs in the office today that shows increased lordosis, no spondylolisthesis. We discussed medication management. I prescribed short course of oral Prednisone for 5 days, when she completes Prednisone I would like her to start Naproxen twice daily. I also refilled Robaxin. I instructed her to continue with home exercise regimen as this would hopefully help to strengthen her back and prevent reoccurring exacerbations of pain. Patient has no questions at this time. No red flag symptoms noted upon exam today.     Meds & Orders:  Meds ordered this encounter  Medications   predniSONE (DELTASONE) 50 MG tablet    Sig: Take 1 tablet (50 mg total) by mouth daily with breakfast. Take until completed.    Dispense:  5 tablet    Refill:  0   methocarbamol (ROBAXIN) 500 MG tablet    Sig: Take 1 tablet (500 mg total) by mouth 3 (three) times daily.    Dispense:  90 tablet    Refill:  0    Orders Placed This Encounter  Procedures   XR Lumbar Spine Complete    Follow-up: Return for 8 week follow up for re-evaluation.   Procedures: No procedures performed      Clinical History: No specialty comments available.   She reports that she has quit smoking. She has never used smokeless tobacco. No results for input(s): "HGBA1C", "LABURIC" in the last 8760 hours.  Objective:  VS:  HT:    WT:   BMI:     BP:(!) 161/92  HR:78bpm  TEMP: ( )  RESP:  Physical Exam Vitals and nursing note reviewed.  HENT:     Head: Normocephalic and atraumatic.     Right Ear: External ear normal.     Left Ear: External ear normal.     Nose: Nose normal.     Mouth/Throat:     Mouth: Mucous membranes are moist.  Eyes:     Extraocular Movements: Extraocular movements intact.  Cardiovascular:     Rate and Rhythm: Normal rate.     Pulses: Normal pulses.   Pulmonary:     Effort: Pulmonary effort is normal.  Abdominal:     General: Abdomen is flat. There is no distension.  Musculoskeletal:        General: Tenderness present.     Cervical back: Normal range of motion.     Comments: Patient rises from seated position to standing without difficulty. Good lumbar range of motion. No pain noted with facet loading. 5/5 strength noted with bilateral hip flexion, knee flexion/extension, ankle dorsiflexion/plantarflexion and EHL. No clonus noted bilaterally. No pain upon palpation of greater trochanters. No pain with internal/external rotation of bilateral hips. Sensation intact bilaterally. Myofascial tenderness noted to bilateral lumbar paraspinal regions upon palpation. Negative slump test bilaterally. Ambulates without aid, gait steady.     Skin:    General: Skin is warm and dry.     Capillary Refill: Capillary refill takes less than 2 seconds.  Neurological:     General: No focal deficit present.     Mental Status: She is alert and oriented to person, place, and time.  Psychiatric:        Mood and Affect: Mood normal.        Behavior: Behavior normal.     Ortho Exam  Imaging: XR Lumbar Spine Complete Result Date: 11/27/2023 Lumbar spine radiographs exhibit increased lordosis, well preserved disc spacing, no spondylolisthesis, no pars defects. No fractures or dislocations.   Past Medical/Family/Surgical/Social History: Medications & Allergies reviewed per EMR, new medications updated. 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

## 2023-11-27 NOTE — Progress Notes (Unsigned)
 Pain Score:   8

## 2023-12-01 ENCOUNTER — Telehealth: Payer: Self-pay | Admitting: Physical Medicine and Rehabilitation

## 2023-12-01 NOTE — Telephone Encounter (Signed)
 Patient would like some pain medication called in for her. She came by.

## 2023-12-04 ENCOUNTER — Other Ambulatory Visit: Payer: Self-pay | Admitting: Physical Medicine and Rehabilitation

## 2023-12-04 MED ORDER — TRAMADOL HCL 50 MG PO TABS: 50.0000 mg | ORAL_TABLET | Freq: Three times a day (TID) | ORAL | 0 refills | Status: AC | PRN

## 2024-01-26 ENCOUNTER — Ambulatory Visit: Payer: Medicaid Other | Admitting: Physical Medicine and Rehabilitation

## 2024-02-08 ENCOUNTER — Other Ambulatory Visit: Payer: Self-pay | Admitting: Physician Assistant

## 2024-02-08 ENCOUNTER — Ambulatory Visit
Admission: RE | Admit: 2024-02-08 | Discharge: 2024-02-08 | Disposition: A | Discharge status: Home / Self Care | Source: Ambulatory Visit | Attending: Physician Assistant | Admitting: Physician Assistant

## 2024-02-08 DIAGNOSIS — M25562 Pain in left knee: Secondary | ICD-10-CM

## 2024-05-07 ENCOUNTER — Encounter: Payer: Self-pay | Admitting: Physical Medicine and Rehabilitation

## 2024-05-07 ENCOUNTER — Ambulatory Visit: Admitting: Physical Medicine and Rehabilitation

## 2024-05-07 DIAGNOSIS — M545 Low back pain, unspecified: Secondary | ICD-10-CM

## 2024-05-07 DIAGNOSIS — G8929 Other chronic pain: Secondary | ICD-10-CM | POA: Diagnosis not present

## 2024-05-07 DIAGNOSIS — M25562 Pain in left knee: Secondary | ICD-10-CM | POA: Diagnosis not present

## 2024-05-07 DIAGNOSIS — M7918 Myalgia, other site: Secondary | ICD-10-CM

## 2024-05-07 NOTE — Progress Notes (Signed)
 [name] - 48 y.o. female MRN [id number]  Date of birth: [birth year]  Office Visit Note: Visit Date: 05/07/2024 PCP: Patient, No Pcp Per Referred by: No ref. provider found  Subjective: Chief Complaint  Patient presents with   Lower Back - Follow-up   HPI: [name] is a 48 y.o. female who comes in today for evaluation of chronic bilateral lower back pain. She is here today in follow up from lumbar myofascial strain in February of this year. States her lower back feels much better, however left knee is sore and painful. Left knee pain started about 2 weeks ago while standing at work. Her pain is intermittent, describes as throbbing sensation, currently rates as 8 out of 10 with standing. Sitting seems to alleviate her pain. Some relief of pain with home exercise regimen, rest and use of over the counter medications. She does wear left knee brace at work. No history of formal physical therapy for left knee. 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 Patient currently working at [employer]. No recent trauma or falls.         Review of Systems  Musculoskeletal:  Positive for joint pain.  Neurological:  Negative for tingling, sensory change, focal weakness and weakness.  All other systems reviewed and are negative.  Otherwise per HPI.  Assessment & Plan: Visit Diagnoses:    ICD-10-CM   1. Chronic bilateral low back pain without sciatica  M54.50    G89.29     2. Myofascial pain syndrome  M79.18     3. Chronic pain of left knee  M25.562 Ambulatory referral to Physical Therapy   G89.29        Plan: Findings:  Chronic bilateral lower back pain. Her lower back is not bothering her at this time. Left knee seems to be biggest source of pain. She continues with conservative therapies such as home exercise regimen, rest and use of medications. Patients clinical presentation and exam are consistent with intrinsic left knee  issue. Her pain does not seem radicular in nature. We discussed treatment plan in detail today. Recent radiographs of left knee show very mild osteoarthritic changes. I would like her to try topical Voltaren gel 4 grams to left knee four times a day. I also encouraged her to continue wear left knee brace at work for comfort. I also placed order for short course of formal physical therapy. She does have mild medial joint line tenderness upon exam today, full range of motion noted. Would consider referral to Dr. Brien if her left knee pain persists. Should her lower back pain return we would consider obtaining lumbar MRI imaging. No red flag symptoms noted upon exam today.     Meds & Orders: No orders of the defined types were placed in this encounter.   Orders Placed This Encounter  Procedures   Ambulatory referral to Physical Therapy    Follow-up: Return if symptoms worsen or fail to improve.   Procedures: No procedures performed      Clinical History: No specialty comments available.   She reports that she has quit smoking. She has never used smokeless tobacco. No results for input(s): HGBA1C, LABURIC in the last 8760 hours.  Objective:  VS:  HT:    WT:   BMI:     BP:   HR: bpm  TEMP: ( )  RESP:  Physical Exam Vitals and nursing note reviewed.  HENT:     Head: Normocephalic  and atraumatic.     Right Ear: External ear normal.     Left Ear: External ear normal.     Nose: Nose normal.     Mouth/Throat:     Mouth: Mucous membranes are moist.  Eyes:     Extraocular Movements: Extraocular movements intact.  Cardiovascular:     Rate and Rhythm: Normal rate.     Pulses: Normal pulses.  Pulmonary:     Effort: Pulmonary effort is normal.  Abdominal:     General: Abdomen is flat. There is no distension.  Musculoskeletal:        General: Tenderness present.     Cervical back: Normal range of motion.     Comments: Patient rises from seated position to standing without  difficulty. Good lumbar range of motion. No pain noted with facet loading. 5/5 strength noted with bilateral hip flexion, knee flexion/extension, ankle dorsiflexion/plantarflexion and EHL. No clonus noted bilaterally. No pain upon palpation of greater trochanters. No pain with internal/external rotation of bilateral hips. Sensation intact bilaterally. Negative slump test bilaterally. Ambulates without aid, gait steady.   Mild medial joint line tenderness to left knee. Full ROM noted. No effusion.     Skin:    General: Skin is warm and dry.     Capillary Refill: Capillary refill takes less than 2 seconds.  Neurological:     General: No focal deficit present.     Mental Status: She is alert and oriented to person, place, and time.  Psychiatric:        Mood and Affect: Mood normal.        Behavior: Behavior normal.     Ortho Exam  Imaging: No results found.  Past Medical/Family/Surgical/Social History: Medications & Allergies reviewed per EMR, new medications updated. 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

## 2024-05-07 NOTE — Progress Notes (Signed)
 Pain Scale   Average Pain 0 Patient advising she has completed her PT and her back is much better. Patient advising she has knee pain now.        +Driver, -BT, -Dye Allergies.

## 2024-05-27 ENCOUNTER — Other Ambulatory Visit: Payer: Self-pay

## 2024-05-27 ENCOUNTER — Ambulatory Visit: Attending: Physical Medicine and Rehabilitation

## 2024-05-27 DIAGNOSIS — R2689 Other abnormalities of gait and mobility: Secondary | ICD-10-CM | POA: Diagnosis present

## 2024-05-27 DIAGNOSIS — G8929 Other chronic pain: Secondary | ICD-10-CM | POA: Insufficient documentation

## 2024-05-27 DIAGNOSIS — M25562 Pain in left knee: Secondary | ICD-10-CM | POA: Insufficient documentation

## 2024-05-27 DIAGNOSIS — M6281 Muscle weakness (generalized): Secondary | ICD-10-CM | POA: Diagnosis present

## 2024-05-27 NOTE — Therapy (Signed)
 OUTPATIENT PHYSICAL THERAPY LOWER EXTREMITY EVALUATION   Patient Name: [name] MRN: [id number] DOB:[birth year], 48 y.o., female Today's Date: 05/27/2024  END OF SESSION:  PT End of Session - 05/27/24 1032     Visit Number 1    Number of Visits 17    Date for PT Re-Evaluation 07/22/24    Authorization Type [place] [employer]    PT Start Time [phone]    PT Stop Time 0929    PT Time Calculation (min) 39 min    Activity Tolerance Patient tolerated treatment well    Behavior During Therapy [facility] for tasks assessed/performed          Past Medical History:  Diagnosis Date   Kidney stone    Past Surgical History:  Procedure Laterality Date   KIDNEY STONE SURGERY     TUBAL LIGATION     Patient Active Problem List   Diagnosis Date Noted   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 She denies trauma or MOI but notes pain started after she moved out of her home in late July and had to carry things up/down the stairs multiple times. Her L knee will pop sometimes when squatting but she denies L knee buckling or clunking.   PERTINENT HISTORY: None  PAIN:  Are you having pain?  Yes: NPRS scale: 0/10 Worst: 9/10 Pain location: L lateral knee joint line Pain description: sharp, sore Aggravating factors: walking, stairs, squatting Relieving factors: cold, rest  PRECAUTIONS: None  RED FLAGS: None   WEIGHT BEARING RESTRICTIONS: No  FALLS:  Has patient fallen in last 6 months? 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  DIAGNOSTIC FINDINGS: see imaging   PATIENT SURVEYS:  LEFS  Extreme difficulty/unable (0), Quite a bit of difficulty (1), Moderate difficulty (2), Little difficulty (3), No difficulty (4) Survey date:  05/27/2024  Any of your usual work, housework or school activities 2  2. Usual hobbies, recreational or sporting activities 4  3. Getting into/out of the bath 3  4. Walking between rooms 0  5. Putting on socks/shoes 0  6. Squatting  2  7. Lifting an object, like a bag of groceries from the floor 0  8. Performing light activities around your home 2  9. Performing heavy activities around your home 0  10. Getting into/out of a car 4  11. Walking 2 blocks 2  12. Walking 1 mile 1  13. Going up/down 10 stairs (1 flight) 1  14. Standing for 1 hour 4  15.  sitting for 1 hour 4  16. Running on even ground 1  17. Running on uneven ground 1  18. Making sharp turns while running fast 0  19. Hopping  1  20. Rolling over in bed 4  Score total:  39/80     COGNITION: Overall cognitive status: Within functional limits for tasks assessed     SENSATION: WFL  POSTURE: rounded shoulders, forward head, and genu valgus  PALPATION: TTP to L ITB  LOWER EXTREMITY ROM:  Active ROM Right eval Left eval  Hip flexion    Hip extension    Hip abduction    Hip adduction    Hip internal rotation    Hip external rotation    Knee flexion    Knee extension 0 0  Ankle dorsiflexion 135 125  Ankle plantarflexion    Ankle inversion    Ankle eversion     (Blank rows = not tested)  LOWER EXTREMITY MMT:  MMT Right eval Left eval  Hip flexion 5 3+   Hip extension    Hip abduction 4 3  Hip adduction    Hip internal rotation    Hip external rotation    Knee flexion 5 4  Knee extension 5 3+  Ankle dorsiflexion    Ankle plantarflexion    Ankle inversion    Ankle eversion     (Blank rows = not tested)  LOWER EXTREMITY SPECIAL TESTS:  DNT  FUNCTIONAL TESTS:  30 Second Sit to Stand: 8 reps no UE Functional Squat: decreased depth, better with heel lift  GAIT: Distance walked: 45ft Assistive device utilized: None Level of assistance: Complete Independence Comments: slight L antalgia    TREATMENT: [facility] Adult PT Treatment:                                                DATE: 05/27/2024 Therapeutic Exercise: Supine QS x 5 - 5 hold SLR x 5 L S/L hip abd x 5 L Supine ITB stretch x 30 L Manual Therapy: STM and TPR to L distal ITB  PATIENT EDUCATION:  Education details: eval findings, LEFS, HEP, POC Person educated: Patient Education method: Explanation, Demonstration, and Handouts Education comprehension: verbalized understanding and returned demonstration  HOME EXERCISE PROGRAM: Access Code: [email or web address] URL: [email or web address] Date: 05/27/2024 Prepared by: Alm Kingdom  Exercises - Supine Quadricep Sets  - 1 x daily - 7 x weekly - 2 sets - 10 reps - 5 sec hold - Active Straight Leg Raise with Quad Set  - 1 x daily - 7 x weekly - 2-3 sets - 10 reps - Sidelying Hip Abduction  - 1 x daily - 7 x weekly - 2-3 sets - 10 reps - Supine ITB Stretch with Strap (Mirrored)  - 1 x daily - 7 x weekly - 2-3 reps - 30 hold - Gastroc Stretch on Wall  - 1 x daily - 7 x weekly - 2 reps - 30 sec hold  ASSESSMENT:  CLINICAL IMPRESSION: Patient is a 48 y.o. F who was seen today for physical therapy evaluation and treatment for acute L knee pain. Physical findings are consistent with referring provider impression as pt demonstrates decreased knee ROM and strength as well as pain in L ITB. LEFS scores shows severe  disability in performance of home ADLs and higher level activities. She would benefit from skilled PT services working on improving L LE strength, L knee ROM, and manual therapy for decreasing L ITB pain.    OBJECTIVE IMPAIRMENTS: Abnormal gait, decreased activity tolerance, decreased endurance, decreased mobility, difficulty walking, decreased  ROM, decreased strength, and pain  ACTIVITY LIMITATIONS: carrying, lifting, sitting, standing, squatting, stairs, transfers, and locomotion level  PARTICIPATION LIMITATIONS: driving, shopping, community activity, occupation, and yard work  PERSONAL FACTORS: Time since onset of injury/illness/exacerbation are also affecting patient's functional outcome.   REHAB POTENTIAL: Excellent  CLINICAL DECISION MAKING: Stable/uncomplicated  EVALUATION COMPLEXITY: Low   GOALS: Goals reviewed with patient? No  SHORT TERM GOALS: Target date: 06/17/2024   Pt will be compliant and knowledgeable with initial HEP for improved comfort and carryover Baseline: initial HEP given  Goal status: INITIAL  2.  Pt will self report left knee pain no greater than 6/10 for improved comfort and functional ability Baseline: 9/10 at worst Goal status: INITIAL   LONG TERM GOALS: Target date: 07/22/2024   Pt will improve LEFS to no less than 50/80 as proxy for functional improvement with home ADLs and higher level community activity Baseline: 39/80 Goal status: INITIAL   2.  Pt will self report left knee pain no greater than 3/10 for improved comfort and functional ability Baseline: 9/10 at worst Goal status: INITIAL   3.  Pt will increase 30 Second Sit to Stand rep count to no less than 10 reps for improved balance, strength, and functional mobility Baseline: 8 reps  Goal status: INITIAL   4.  Pt will improve L LE MMT to at least 4/5 for all tested motions for improved comfort and functional mobility Baseline: see MMT chart Goal status: INITIAL    PLAN:  PT  FREQUENCY: 1-2x/week  PT DURATION: 8 weeks  PLANNED INTERVENTIONS: 97164- PT Re-evaluation, 97110-Therapeutic exercises, 97530- Therapeutic activity, [id number]- Neuromuscular re-education, 97535- Self Care, 02859- Manual therapy, [id number]- Gait training, [phone]- Electrical stimulation (unattended), [id number]- Electrical stimulation (manual), 20560 (1-2 muscles), 20561 (3+ muscles)- Dry Needling, Cryotherapy, and Moist heat  PLAN FOR NEXT SESSION: assess HEP response, strengthening, ITB stretching, manual    Alm JAYSON Kingdom, PT 05/27/2024, 10:53 AM

## 2024-06-05 ENCOUNTER — Ambulatory Visit: Attending: Physical Medicine and Rehabilitation | Admitting: Physical Therapy

## 2024-06-05 ENCOUNTER — Encounter: Payer: Self-pay | Admitting: Physical Therapy

## 2024-06-05 DIAGNOSIS — R2689 Other abnormalities of gait and mobility: Secondary | ICD-10-CM | POA: Diagnosis present

## 2024-06-05 DIAGNOSIS — M6281 Muscle weakness (generalized): Secondary | ICD-10-CM | POA: Diagnosis present

## 2024-06-05 DIAGNOSIS — M25562 Pain in left knee: Secondary | ICD-10-CM | POA: Diagnosis present

## 2024-06-05 NOTE — Therapy (Signed)
 OUTPATIENT PHYSICAL THERAPY LOWER EXTREMITY EVALUATION   Patient Name: [name] MRN: [id number] DOB:April 10, 1976, 48 y.o., female Today's Date: 06/05/2024  END OF SESSION:  PT End of Session - 06/05/24 1416     Visit Number 2    Number of Visits 17    Date for PT Re-Evaluation 07/22/24    Authorization Type [place] [employer]    PT Start Time 1416    PT Stop Time 1458    PT Time Calculation (min) 42 min           Past Medical History:  Diagnosis Date   Kidney stone    Past Surgical History:  Procedure Laterality Date   KIDNEY STONE SURGERY     TUBAL LIGATION     Patient Active Problem List   Diagnosis Date Noted   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  Evaluation: Pt presents to PT with acute onset of L lateral knee pain. She denies trauma or MOI but notes pain started after she moved out of her home in late July and had to carry things up/down the stairs multiple times. Her L knee will pop sometimes when squatting but she denies L knee buckling or clunking.   PERTINENT HISTORY: None  PAIN:  Are you having pain?  Yes: NPRS scale: 0/10 Worst: 9/10 Pain location: L lateral knee joint line Pain description: sharp, sore Aggravating factors: walking, stairs, squatting Relieving factors: cold, rest  PRECAUTIONS: None  RED FLAGS: None   WEIGHT BEARING RESTRICTIONS: No  FALLS:  Has patient fallen in last 6  months? 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  DIAGNOSTIC FINDINGS: see imaging   PATIENT SURVEYS:  LEFS  Extreme difficulty/unable (0), Quite a bit of difficulty (1), Moderate difficulty (2), Little difficulty (3), No difficulty (4) Survey date:  05/27/2024  Any of your usual work, housework or school activities 2  2. Usual hobbies, recreational or sporting activities 4  3. Getting into/out of the bath 3  4. Walking between rooms 0  5. Putting on socks/shoes 0  6. Squatting  2  7. Lifting an object, like a bag of groceries from the floor 0  8. Performing light activities around your home 2  9. Performing heavy activities around your home 0  10. Getting into/out of a car 4  11. Walking 2 blocks 2  12. Walking 1 mile 1  13. Going up/down 10 stairs (1 flight) 1  14. Standing for 1 hour 4  15.  sitting for 1 hour 4  16. Running on even ground 1  17. Running on uneven ground 1  18. Making sharp turns  while running fast 0  19. Hopping  1  20. Rolling over in bed 4  Score total:  39/80     COGNITION: Overall cognitive status: Within functional limits for tasks assessed     SENSATION: WFL  POSTURE: rounded shoulders, forward head, and genu valgus  PALPATION: TTP to L ITB  LOWER EXTREMITY ROM:  Active ROM Right eval Left eval  Hip flexion    Hip extension    Hip abduction    Hip adduction    Hip internal rotation    Hip external rotation    Knee flexion    Knee extension 0 0  Ankle dorsiflexion 135 125  Ankle plantarflexion    Ankle inversion    Ankle eversion     (Blank rows = not tested)  LOWER EXTREMITY MMT:  MMT  Right eval Left eval  Hip flexion 5 3+  Hip extension    Hip abduction 4 3  Hip adduction    Hip internal rotation    Hip external rotation    Knee flexion 5 4  Knee extension 5 3+  Ankle dorsiflexion    Ankle plantarflexion    Ankle inversion    Ankle eversion     (Blank rows = not tested)  LOWER EXTREMITY SPECIAL TESTS:  DNT  FUNCTIONAL TESTS:  30 Second Sit to Stand: 8 reps no UE Functional Squat: decreased depth, better with heel lift  GAIT: Distance walked: 39ft Assistive device utilized: None Level of assistance: Complete Independence Comments: slight L antalgia    TREATMENT: [facility] Adult PT Treatment:                                                DATE: 06/05/24 MTPR along the vastus lateralis  Low load long duration medial patellar glide SAQ 2 x 12 with ball squeeze SLR with quad set 1 x 10 Sidelying ITB Stretch 2  x3 Sidelying hip abduction 2 x 10 Stair training up/ down 6 inch steps Step downs 2 x 10 from 6 inch step LLE only Sit to stand with GTB around knees.  Reviewed and updated HEP today.   [facility] Adult PT Treatment:                                                DATE: 05/27/2024 Therapeutic Exercise: Supine QS x 5 - 5 hold SLR x 5 L S/L hip abd x 5 L Supine ITB stretch x 30 L Manual Therapy: STM and TPR to L distal ITB  PATIENT EDUCATION:  Education details: eval findings, LEFS, HEP, POC Person educated: Patient Education method: Explanation, Demonstration, and Handouts Education comprehension: verbalized understanding and returned demonstration  HOME EXERCISE PROGRAM: Access Code: [email or web address] URL: [email or web address] Date: 06/05/2024 Prepared by: Joneen Fresh  Exercises - Supine Quadricep Sets  - 1 x daily - 7 x weekly - 2 sets - 10 reps - 5 sec hold - Active Straight Leg Raise with Quad Set  - 1 x daily - 7 x weekly - 2-3 sets - 10 reps - Sidelying Hip Abduction  - 1 x daily - 7 x weekly - 2-3 sets - 10 reps - Supine  ITB Stretch with Strap (Mirrored)  - 1 x daily -  7 x weekly - 2-3 reps - 30 hold - Gastroc Stretch on Wall  - 1 x daily - 7 x weekly - 2 reps - 30 sec hold - Sidelying ITB Stretch off Table (Mirrored)  - 2 x daily - 7 x weekly - 2 sets - 2 reps - 30 seconds hold - Sit to Stand with Resistance Around Legs  - 1 x daily - 7 x weekly - 2 sets - 10 reps  ASSESSMENT:  CLINICAL IMPRESSION: 06/05/2024 Mrs Greeson arrives to PT today reporting that she feels she is doing better and is consistent with her HEP. She does report intermittent 10/10 pain with walking navigating steps that is fleeting. Worked on patellar stability today with VMO activation and vastus lateralis STW. She did well with strengthening and practiced stair training requiring min cues for proper form. End of session she noted no pain end of session.   Evaluation: Patient is a 48 y.o. F who was seen today for physical therapy evaluation and treatment for acute L knee pain. Physical findings are consistent with referring provider impression as pt demonstrates decreased knee ROM and strength as well as pain in L ITB. LEFS scores shows severe disability in performance of home ADLs and higher level activities. She would benefit from skilled PT services working on improving L LE strength, L knee ROM, and manual therapy for decreasing L ITB pain.    OBJECTIVE IMPAIRMENTS: Abnormal gait, decreased activity tolerance, decreased endurance, decreased mobility, difficulty walking, decreased ROM, decreased strength, and pain  ACTIVITY LIMITATIONS: carrying, lifting, sitting, standing, squatting, stairs, transfers, and locomotion level  PARTICIPATION LIMITATIONS: driving, shopping, community activity, occupation, and yard work  PERSONAL FACTORS: Time since onset of injury/illness/exacerbation are also affecting patient's functional outcome.   REHAB POTENTIAL: Excellent  CLINICAL DECISION MAKING: Stable/uncomplicated  EVALUATION COMPLEXITY:  Low   GOALS: Goals reviewed with patient? No  SHORT TERM GOALS: Target date: 06/17/2024   Pt will be compliant and knowledgeable with initial HEP for improved comfort and carryover Baseline: initial HEP given  Goal status: INITIAL  2.  Pt will self report left knee pain no greater than 6/10 for improved comfort and functional ability Baseline: 9/10 at worst Goal status: INITIAL   LONG TERM GOALS: Target date: 07/22/2024   Pt will improve LEFS to no less than 50/80 as proxy for functional improvement with home ADLs and higher level community activity Baseline: 39/80 Goal status: INITIAL   2.  Pt will self report left knee pain no greater than 3/10 for improved comfort and functional ability Baseline: 9/10 at worst Goal status: INITIAL   3.  Pt will increase 30 Second Sit to Stand rep count to no less than 10 reps for improved balance, strength, and functional mobility Baseline: 8 reps  Goal status: INITIAL   4.  Pt will improve L LE MMT to at least 4/5 for all tested motions for improved comfort and functional mobility Baseline: see MMT chart Goal status: INITIAL    PLAN:  PT FREQUENCY: 1-2x/week  PT DURATION: 8 weeks  PLANNED INTERVENTIONS: 97164- PT Re-evaluation, 97110-Therapeutic exercises, 97530- Therapeutic activity, [id number]- Neuromuscular re-education, 97535- Self Care, 02859- Manual therapy, [id number]- Gait training, H9716- Electrical stimulation (unattended), [id number]- Electrical stimulation (manual), 20560 (1-2 muscles), 20561 (3+ muscles)- Dry Needling, Cryotherapy, and Moist heat  PLAN FOR NEXT SESSION: assess HEP response, strengthening, ITB stretching, manual    Yanuel Tagg PT, DPT, LAT, ATC  06/05/24  3:07 PM

## 2024-06-11 ENCOUNTER — Ambulatory Visit

## 2024-06-11 DIAGNOSIS — R2689 Other abnormalities of gait and mobility: Secondary | ICD-10-CM

## 2024-06-11 DIAGNOSIS — M6281 Muscle weakness (generalized): Secondary | ICD-10-CM

## 2024-06-11 DIAGNOSIS — M25562 Pain in left knee: Secondary | ICD-10-CM

## 2024-06-11 NOTE — Therapy (Signed)
 OUTPATIENT PHYSICAL THERAPY TREATMENT   Patient Name: [name] MRN: [id number] DOB:August 07, 1976, 48 y.o., female Today's Date: 06/11/2024  END OF SESSION:  PT End of Session - 06/11/24 0842     Visit Number 3    Number of Visits 17    Date for PT Re-Evaluation 07/22/24    Authorization Type [place] [facility]    PT Start Time 0845    PT Stop Time 0923    PT Time Calculation (min) 38 min            Past Medical History:  Diagnosis Date   Kidney stone    Past Surgical History:  Procedure Laterality Date   KIDNEY STONE SURGERY     TUBAL LIGATION     Patient Active Problem List   Diagnosis Date Noted   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 Has been compliant with HEP.   Evaluation: Pt presents to PT with acute onset of L lateral knee pain. She denies trauma or MOI but notes pain started after she moved out of her home in late July and had to carry things up/down the stairs multiple times. Her L knee will pop sometimes when squatting but she denies L knee buckling or clunking.   PERTINENT HISTORY: None  PAIN:  Are you having pain?  Yes: NPRS scale: 0/10 Worst: 9/10 Pain location: L lateral knee joint line Pain description: sharp, sore Aggravating factors: walking, stairs, squatting Relieving factors: cold, rest  PRECAUTIONS: None  RED FLAGS: None   WEIGHT BEARING RESTRICTIONS: No  FALLS:  Has patient fallen in last 6 months? 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  DIAGNOSTIC FINDINGS: see imaging   PATIENT SURVEYS:  LEFS  Extreme difficulty/unable (0), Quite a bit of difficulty (1), Moderate difficulty (2), Little difficulty (3), No difficulty (4) Survey date:  05/27/2024  Any of your usual work, housework or school activities 2  2. Usual hobbies, recreational or sporting activities 4  3. Getting into/out of the bath 3  4. Walking between rooms 0  5. Putting on socks/shoes 0  6. Squatting  2  7. Lifting an object, like a bag of groceries from the floor 0  8. Performing light activities around your home 2  9. Performing heavy activities around your home 0  10. Getting into/out of a car 4  11. Walking 2 blocks 2  12. Walking 1 mile 1  13. Going up/down 10 stairs (1 flight) 1  14. Standing for 1 hour 4  15.  sitting for 1 hour 4  16. Running on even ground 1  17. Running on uneven ground 1  18. Making sharp turns while running fast 0  19. Hopping  1  20. Rolling over in bed 4  Score total:  39/80     COGNITION: Overall cognitive status: Within functional limits for tasks assessed     SENSATION: WFL  POSTURE: rounded shoulders, forward head, and genu valgus  PALPATION: TTP to L ITB  LOWER EXTREMITY ROM:  Active ROM Right eval Left eval  Hip flexion    Hip extension    Hip abduction    Hip adduction    Hip internal rotation    Hip external rotation    Knee flexion    Knee extension 0 0  Ankle dorsiflexion 135 125  Ankle plantarflexion    Ankle inversion    Ankle eversion     (Blank rows = not tested)  LOWER EXTREMITY MMT:  MMT Right eval Left eval  Hip flexion 5 3+  Hip extension     Hip abduction 4 3  Hip adduction    Hip internal rotation    Hip external rotation    Knee flexion 5 4  Knee extension 5 3+  Ankle dorsiflexion    Ankle plantarflexion    Ankle inversion    Ankle eversion     (Blank rows = not tested)  LOWER EXTREMITY SPECIAL TESTS:  DNT  FUNCTIONAL TESTS:  30 Second Sit to Stand: 8 reps no UE Functional Squat: decreased depth, better with heel lift  GAIT: Distance walked: 63ft Assistive device utilized: None Level of assistance: Complete Independence Comments: slight L antalgia    TREATMENT: [facility] Adult PT Treatment:                                                DATE: 06/11/24 NuStep lvl 5 UE/LE x 3 min for functional activity tolerance Supine QS x 10 - 5 hold Supine SLR 2x10 IASTM to L ITB  Manual stretching to L ITB Bridge 2x10 LAQ 3x10 2.5lb L Step up 2x10 L stance fwd 6in Standing hip abd 2x10 25lb TKE with ball 2x10 L - 5 hold  [facility] Adult PT Treatment:                                                DATE: 06/05/24 MTPR along the vastus lateralis  Low load long duration medial patellar glide SAQ 2 x 12 with ball squeeze SLR with quad set 1 x 10 Sidelying ITB Stretch 2  x3 Sidelying hip abduction 2 x 10 Stair training up/ down 6 inch steps Step downs 2 x 10 from 6 inch step LLE only Sit to stand with GTB around knees.  Reviewed and updated HEP today  [facility] Adult PT Treatment:                                                DATE: 05/27/2024 Therapeutic Exercise: Supine QS x 5 - 5 hold SLR x 5 L S/L hip abd x 5 L Supine ITB stretch x 30 L Manual Therapy: STM and TPR to L distal ITB  PATIENT EDUCATION:  Education details: eval findings, LEFS, HEP, POC Person educated: Patient Education method: Explanation, Demonstration, and Handouts Education comprehension:  verbalized understanding and returned demonstration  HOME EXERCISE PROGRAM: Access Code: [email or web address] URL: [email or web address] Date:  06/05/2024 Prepared by: Joneen Fresh  Exercises - Supine Quadricep Sets  - 1 x daily - 7 x weekly - 2 sets - 10 reps - 5 sec hold - Active Straight Leg Raise with Quad Set  - 1 x daily - 7 x weekly - 2-3 sets - 10 reps - Sidelying Hip Abduction  - 1 x daily - 7 x weekly - 2-3 sets - 10 reps - Supine ITB Stretch with Strap (Mirrored)  - 1 x daily - 7 x weekly - 2-3 reps - 30 hold - Gastroc Stretch on Wall  - 1 x daily - 7 x weekly - 2 reps - 30 sec hold - Sidelying ITB Stretch off Table (Mirrored)  - 2 x daily - 7 x weekly - 2 sets - 2 reps - 30 seconds hold - Sit to Stand with Resistance Around Legs  - 1 x daily - 7 x weekly - 2 sets - 10 reps  ASSESSMENT:  CLINICAL IMPRESSION: Pt was able to complete all prescribed exercises with no adverse effect. Today we focused on improving quad and proximal hip strength for decreasing L knee pain. Continues to benefit from skilled PT services, will progress as able per POC.   Evaluation: Patient is a 48 y.o. F who was seen today for physical therapy evaluation and treatment for acute L knee pain. Physical findings are consistent with referring provider impression as pt demonstrates decreased knee ROM and strength as well as pain in L ITB. LEFS scores shows severe disability in performance of home ADLs and higher level activities. She would benefit from skilled PT services working on improving L LE strength, L knee ROM, and manual therapy for decreasing L ITB pain.    OBJECTIVE IMPAIRMENTS: Abnormal gait, decreased activity tolerance, decreased endurance, decreased mobility, difficulty walking, decreased ROM, decreased strength, and pain  ACTIVITY LIMITATIONS: carrying, lifting, sitting, standing, squatting, stairs, transfers, and locomotion level  PARTICIPATION LIMITATIONS: driving, shopping, community activity, occupation, and yard work  PERSONAL FACTORS: Time since onset of injury/illness/exacerbation are also affecting patient's functional  outcome.   REHAB POTENTIAL: Excellent  CLINICAL DECISION MAKING: Stable/uncomplicated  EVALUATION COMPLEXITY: Low   GOALS: Goals reviewed with patient? No  SHORT TERM GOALS: Target date: 06/17/2024   Pt will be compliant and knowledgeable with initial HEP for improved comfort and carryover Baseline: initial HEP given  Goal status: INITIAL  2.  Pt will self report left knee pain no greater than 6/10 for improved comfort and functional ability Baseline: 9/10 at worst Goal status: INITIAL   LONG TERM GOALS: Target date: 07/22/2024   Pt will improve LEFS to no less than 50/80 as proxy for functional improvement with home ADLs and higher level community activity Baseline: 39/80 Goal status: INITIAL   2.  Pt will self report left knee pain no greater than 3/10 for improved comfort and functional ability Baseline: 9/10 at worst Goal status: INITIAL   3.  Pt will increase 30 Second Sit to Stand rep count to no less than 10 reps for improved balance, strength, and functional mobility Baseline: 8 reps  Goal status: INITIAL   4.  Pt will improve L LE MMT to at least 4/5 for all tested motions for improved comfort and functional mobility Baseline: see MMT chart Goal status: INITIAL    PLAN:  PT FREQUENCY: 1-2x/week  PT DURATION: 8 weeks  PLANNED INTERVENTIONS: 02835- PT Re-evaluation,  97110-Therapeutic exercises, 97530- Therapeutic activity, [id number]- Neuromuscular re-education, [id number]- Self Care, 02859- Manual therapy, [id number]- Gait training, [phone]- Electrical stimulation (unattended), [id number]- Electrical stimulation (manual), [phone] (1-2 muscles), 20561 (3+ muscles)- Dry Needling, Cryotherapy, and Moist heat  PLAN FOR NEXT SESSION: assess HEP response, strengthening, ITB stretching, manual    Alm JAYSON Kingdom PT  06/11/24 9:26 AM

## 2024-06-18 NOTE — Therapy (Signed)
 OUTPATIENT PHYSICAL THERAPY TREATMENT   Patient Name: [name] MRN: [id number] DOB:[birth year], 48 y.o., female Today's Date: 06/19/2024  END OF SESSION:  PT End of Session - 06/19/24 0935     Visit Number 4    Number of Visits 17    Date for PT Re-Evaluation 07/22/24    Authorization Type [place] [employer]    PT Start Time 0935    PT Stop Time 1015    PT Time Calculation (min) 40 min             Past Medical History:  Diagnosis Date   Kidney stone    Past Surgical History:  Procedure Laterality Date   KIDNEY STONE SURGERY     TUBAL LIGATION     Patient Active Problem List   Diagnosis Date Noted   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 Has been compliant with HEP and feels like she is doing much better.   Evaluation: Pt presents to PT with acute onset of L lateral knee pain. She denies trauma or MOI but notes pain started after she moved out of her home in late July and had to carry things up/down the stairs multiple times. Her L knee will pop sometimes when squatting but she denies L knee buckling or clunking.   PERTINENT HISTORY: None  PAIN:  Are you having pain?  Yes: NPRS scale: 0/10 Worst: 9/10 Pain location: L lateral knee joint line Pain description: sharp, sore Aggravating factors: walking, stairs, squatting Relieving factors: cold, rest  PRECAUTIONS: None  RED FLAGS: None   WEIGHT BEARING RESTRICTIONS: No  FALLS:  Has patient fallen in last 6 months? 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  DIAGNOSTIC FINDINGS: see imaging   PATIENT SURVEYS:  LEFS  Extreme difficulty/unable (0), Quite a bit of difficulty (1), Moderate difficulty (2), Little difficulty (3), No difficulty (4) Survey date:  05/27/2024  Any of your usual work, housework or school activities 2  2. Usual hobbies, recreational or sporting activities 4  3. Getting into/out of the bath 3  4. Walking between rooms 0  5. Putting on socks/shoes 0  6. Squatting  2  7. Lifting an object, like a bag of groceries from the floor 0  8. Performing light activities around your home 2  9. Performing heavy activities around your home 0  10. Getting into/out of a car 4  11. Walking 2 blocks 2  12. Walking 1 mile 1  13. Going up/down 10 stairs (1 flight) 1  14. Standing for 1 hour 4  15.  sitting for 1 hour 4  16. Running on even ground 1  17. Running on uneven ground 1  18. Making sharp turns while  running fast 0  19. Hopping  1  20. Rolling over in bed 4  Score total:  39/80     COGNITION: Overall cognitive status: Within functional limits for tasks assessed     SENSATION: WFL  POSTURE: rounded shoulders, forward head, and genu valgus  PALPATION: TTP to L ITB  LOWER EXTREMITY ROM:  Active ROM Right eval Left eval  Hip flexion    Hip extension    Hip abduction    Hip adduction    Hip internal rotation    Hip external rotation    Knee flexion    Knee extension 0 0  Ankle dorsiflexion 135 125  Ankle plantarflexion    Ankle inversion    Ankle eversion     (Blank rows = not tested)  LOWER EXTREMITY MMT:  MMT Right eval  Left eval  Hip flexion 5 3+  Hip extension    Hip abduction 4 3  Hip adduction    Hip internal rotation    Hip external rotation    Knee flexion 5 4  Knee extension 5 3+  Ankle dorsiflexion    Ankle plantarflexion    Ankle inversion    Ankle eversion     (Blank rows = not tested)  LOWER EXTREMITY SPECIAL TESTS:  DNT  FUNCTIONAL TESTS:  30 Second Sit to Stand: 8 reps no UE Functional Squat: decreased depth, better with heel lift  GAIT: Distance walked: 50ft Assistive device utilized: None Level of assistance: Complete Independence Comments: slight L antalgia    TREATMENT: [facility] Adult PT Treatment:                                                DATE: 06/19/24 NuStep lvl 5 UE/LE x 3 min for functional activity tolerance Supine QS x 10 - 5 hold Supine SLR 2x10 2lb S/L hip abd 2x10 2lb Manual stretching to L ITB STM to L ITB in sidelying Banded bridge 2x10 GTB Lateral walk YTB x 2 laps at counter Standing ext 2x10 YTB  [facility] Adult PT Treatment:                                                DATE: 06/11/24 NuStep lvl 5 UE/LE x 3 min for functional activity tolerance Supine QS x 10 - 5 hold Supine SLR 2x10 IASTM to L ITB  Manual stretching to L ITB Bridge 2x10 LAQ 3x10 2.5lb L Step up 2x10 L stance fwd 6in Standing hip abd 2x10 25lb TKE with ball 2x10 L - 5 hold  [facility] Adult PT Treatment:                                                DATE: 06/05/24 MTPR along the vastus lateralis  Low load long duration medial patellar glide SAQ 2 x 12 with ball squeeze SLR with quad set 1 x 10 Sidelying ITB Stretch 2  x3 Sidelying hip abduction 2 x 10 Stair training up/ down 6 inch steps Step downs 2 x 10 from 6 inch step LLE only Sit to stand with GTB around  knees.  Reviewed and updated HEP today  [facility] Adult PT Treatment:                                                DATE: 05/27/2024 Therapeutic Exercise: Supine QS x 5 - 5 hold SLR x 5 L S/L hip abd x 5 L Supine ITB  stretch x 30 L Manual Therapy: STM and TPR to L distal ITB  PATIENT EDUCATION:  Education details: continue HEP Person educated: Patient Education method: Explanation, Demonstration, and Handouts Education comprehension: verbalized understanding and returned demonstration  HOME EXERCISE PROGRAM: Access Code: [email or web address] URL: [email or web address] Date: 06/05/2024 Prepared by: Joneen Fresh  Exercises - Supine Quadricep Sets  - 1 x daily - 7 x weekly - 2 sets - 10 reps - 5 sec hold - Active Straight Leg Raise with Quad Set  - 1 x daily - 7 x weekly - 2-3 sets - 10 reps - Sidelying Hip Abduction  - 1 x daily - 7 x weekly - 2-3 sets - 10 reps - Supine ITB Stretch with Strap (Mirrored)  - 1 x daily - 7 x weekly - 2-3 reps - 30 hold - Gastroc Stretch on Wall  - 1 x daily - 7 x weekly - 2 reps - 30 sec hold - Sidelying ITB Stretch off Table (Mirrored)  - 2 x daily - 7 x weekly - 2 sets - 2 reps - 30 seconds hold - Sit to Stand with Resistance Around Legs  - 1 x daily - 7 x weekly - 2 sets - 10 reps  ASSESSMENT:  CLINICAL IMPRESSION: Pt was able to complete all prescribed exercises with no adverse effect. Today we focused on improving quad and proximal hip strength for decreasing L knee pain. Will assess goals and discharge readiness at next visit.    Evaluation: Patient is a 48 y.o. F who was seen today for physical therapy evaluation and treatment for acute L knee pain. Physical findings are consistent with referring provider impression as pt demonstrates decreased knee ROM and strength as well as pain in L ITB. LEFS scores shows severe disability in performance of home ADLs and higher level activities. She would benefit from skilled PT services working on improving L LE strength, L knee ROM, and manual therapy for decreasing L ITB pain.    OBJECTIVE IMPAIRMENTS: Abnormal gait, decreased activity tolerance, decreased endurance, decreased mobility, difficulty walking,  decreased ROM, decreased strength, and pain  ACTIVITY LIMITATIONS: carrying, lifting, sitting, standing, squatting, stairs, transfers, and locomotion level  PARTICIPATION LIMITATIONS: driving, shopping, community activity, occupation, and yard work  PERSONAL FACTORS: Time since onset of injury/illness/exacerbation are also affecting patient's functional outcome.   REHAB POTENTIAL: Excellent  CLINICAL DECISION MAKING: Stable/uncomplicated  EVALUATION COMPLEXITY: Low   GOALS: Goals reviewed with patient? No  SHORT TERM GOALS: Target date: 06/17/2024   Pt will be compliant and knowledgeable with initial HEP for improved comfort and carryover Baseline: initial HEP given  Goal status: INITIAL  2.  Pt will self report left knee pain no greater than 6/10 for improved comfort and functional ability Baseline: 9/10 at worst Goal status: INITIAL   LONG TERM GOALS: Target date: 07/22/2024   Pt will improve LEFS to no less than 50/80 as proxy for functional improvement with home ADLs and higher level community activity Baseline: 39/80 Goal status: INITIAL  2.  Pt will self report left knee pain no greater than 3/10 for improved comfort and functional ability Baseline: 9/10 at worst Goal status: INITIAL   3.  Pt will increase 30 Second Sit to Stand rep count to no less than 10 reps for improved balance, strength, and functional mobility Baseline: 8 reps  Goal status: INITIAL   4.  Pt will improve L LE MMT to at least 4/5 for all tested motions for improved comfort and functional mobility Baseline: see MMT chart Goal status: INITIAL    PLAN:  PT FREQUENCY: 1-2x/week  PT DURATION: 8 weeks  PLANNED INTERVENTIONS: [id number]- PT Re-evaluation, 97110-Therapeutic exercises, 97530- Therapeutic activity, [id number]- Neuromuscular re-education, 97535- Self Care, 02859- Manual therapy, [id number]- Gait training, [phone]- Electrical stimulation (unattended), [id number]- Electrical stimulation (manual), 20560  (1-2 muscles), 20561 (3+ muscles)- Dry Needling, Cryotherapy, and Moist heat  PLAN FOR NEXT SESSION: assess HEP response, strengthening, ITB stretching, manual    Alm JAYSON Kingdom PT  06/19/24 11:49 AM

## 2024-06-19 ENCOUNTER — Ambulatory Visit

## 2024-06-19 DIAGNOSIS — M25562 Pain in left knee: Secondary | ICD-10-CM

## 2024-06-19 DIAGNOSIS — R2689 Other abnormalities of gait and mobility: Secondary | ICD-10-CM

## 2024-06-19 DIAGNOSIS — M6281 Muscle weakness (generalized): Secondary | ICD-10-CM

## 2024-06-26 ENCOUNTER — Ambulatory Visit

## 2024-06-26 DIAGNOSIS — M25562 Pain in left knee: Secondary | ICD-10-CM | POA: Diagnosis not present

## 2024-06-26 DIAGNOSIS — R2689 Other abnormalities of gait and mobility: Secondary | ICD-10-CM

## 2024-06-26 DIAGNOSIS — M6281 Muscle weakness (generalized): Secondary | ICD-10-CM

## 2024-06-26 NOTE — Therapy (Signed)
 OUTPATIENT PHYSICAL THERAPY TREATMENT   Patient Name: [name] MRN: [id number] DOB:[birth year], 48 y.o., female Today's Date: 06/26/2024  END OF SESSION:  PT End of Session - 06/26/24 0841     Visit Number 5    Number of Visits 17    Date for Recertification  07/22/24    Authorization Type [place] [employer]    PT Start Time 0845    PT Stop Time 0923    PT Time Calculation (min) 38 min    Activity Tolerance Patient tolerated treatment well    Behavior During Therapy [facility] for tasks assessed/performed              Past Medical History:  Diagnosis Date   Kidney stone    Past Surgical History:  Procedure Laterality Date   KIDNEY STONE SURGERY     TUBAL LIGATION     Patient Active Problem List   Diagnosis Date Noted   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 Has been compliant with HEP. Feels ready for discharge at this time.  Evaluation: Pt presents to PT with acute onset of L lateral knee pain. She denies trauma or MOI but notes pain started after she moved out of her home in late July and had to carry things up/down the stairs multiple times. Her L knee will pop sometimes when squatting but she denies L knee buckling or clunking.   PERTINENT HISTORY: None  PAIN:  Are you having pain?  Yes: NPRS scale: 0/10 Worst: 9/10 Pain location: L lateral knee joint line Pain description: sharp, sore Aggravating factors: walking, stairs, squatting Relieving factors: cold, rest  PRECAUTIONS: None  RED  FLAGS: None   WEIGHT BEARING RESTRICTIONS: No  FALLS:  Has patient fallen in last 6 months? 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  DIAGNOSTIC FINDINGS: see imaging   PATIENT SURVEYS:  LEFS  Extreme difficulty/unable (0), Quite a bit of difficulty (1), Moderate difficulty (2), Little difficulty (3), No difficulty (4) Survey date:  05/27/2024 06/26/2024  Any of your usual work, housework or school activities 2   2. Usual hobbies, recreational or sporting activities 4   3. Getting into/out of the bath 3   4. Walking between rooms 0   5. Putting on socks/shoes 0   6. Squatting  2   7. Lifting an object, like a bag of groceries from the floor 0   8. Performing light activities around your home 2   9. Performing heavy activities around your home 0   10. Getting into/out of a car 4   11. Walking 2 blocks 2   12. Walking 1 mile 1   13. Going up/down 10 stairs (1 flight) 1   14. Standing for 1  hour 4   15.  sitting for 1 hour 4   16. Running on even ground 1   17. Running on uneven ground 1   18. Making sharp turns while running fast 0   19. Hopping  1   20. Rolling over in bed 4   Score total:  39/80 66/80     COGNITION: Overall cognitive status: Within functional limits for tasks assessed     SENSATION: WFL  POSTURE: rounded shoulders, forward head, and genu valgus  PALPATION: TTP to L ITB  LOWER EXTREMITY ROM:  Active ROM Right eval Left eval  Hip flexion    Hip extension    Hip abduction    Hip adduction    Hip internal rotation    Hip external rotation    Knee flexion    Knee extension 0 0  Ankle dorsiflexion 135  125  Ankle plantarflexion    Ankle inversion    Ankle eversion     (Blank rows = not tested)  LOWER EXTREMITY MMT:  MMT Right eval Left eval Left 06/26/24  Hip flexion 5 3+ 4  Hip extension     Hip abduction 4 3 4   Hip adduction     Hip internal rotation     Hip external rotation     Knee flexion 5 4   Knee extension 5 3+ 4  Ankle dorsiflexion     Ankle plantarflexion     Ankle inversion     Ankle eversion      (Blank rows = not tested)  LOWER EXTREMITY SPECIAL TESTS:  DNT  FUNCTIONAL TESTS:  30 Second Sit to Stand: 8 reps no UE  06/26/2024: 9 reps Functional Squat: decreased depth, better with heel lift  GAIT: Distance walked: 38ft Assistive device utilized: None Level of assistance: Complete Independence Comments: slight L antalgia    TREATMENT: [facility] Adult PT Treatment:                                                DATE: 06/26/24 Rec bike lvl 3 x 3 min while taking subjective Assessment of tests/measures, goals, and outcomes for discharge STS x 10 - no UE Supine QS x 10 - 5 hold Supine SLR 2x15 S/L hip abd 2x10 ITB stretch with strap 2x30 L Lateral walk YTB x 2 laps at counter Standing abd 2x10 YTB  [facility] Adult PT Treatment:                                                DATE: 06/19/24 NuStep lvl 5 UE/LE x 3 min for functional activity tolerance Supine QS x 10 - 5 hold Supine SLR 2x10 2lb S/L hip abd 2x10 2lb Manual stretching to L ITB STM to L ITB in sidelying Banded bridge 2x10 GTB Lateral walk YTB x 2 laps at counter Standing ext 2x10 YTB  [facility] Adult PT Treatment:                                                DATE: 06/11/24 NuStep lvl 5 UE/LE x 3 min for  functional activity tolerance Supine QS x 10 - 5 hold Supine SLR 2x10 IASTM to L ITB  Manual stretching to L ITB Bridge 2x10 LAQ 3x10 2.5lb L Step up 2x10 L stance fwd 6in Standing hip abd 2x10 25lb TKE with ball 2x10 L - 5 hold  [facility] Adult PT Treatment:                                                 DATE: 06/05/24 MTPR along the vastus lateralis  Low load long duration medial patellar glide SAQ 2 x 12 with ball squeeze SLR with quad set 1 x 10 Sidelying ITB Stretch 2  x3 Sidelying hip abduction 2 x 10 Stair training up/ down 6 inch steps Step downs 2 x 10 from 6 inch step LLE only Sit to stand with GTB around knees.  Reviewed and updated HEP today  [facility] Adult PT Treatment:                                                DATE: 05/27/2024 Therapeutic Exercise: Supine QS x 5 - 5 hold SLR x 5 L S/L hip abd x 5 L Supine ITB stretch x 30 L Manual Therapy: STM and TPR to L distal ITB  PATIENT EDUCATION:  Education details: continue HEP Person educated: Patient Education method: Explanation, Demonstration, and Handouts Education comprehension: verbalized understanding and returned demonstration  HOME EXERCISE PROGRAM: Access Code: [email or web address] URL: [email or web address] Date: 06/26/2024 Prepared by: Alm Kingdom  Exercises - Supine Quadricep Sets  - 1 x daily - 7 x weekly - 2 sets - 10 reps - 5 sec hold - Active Straight Leg Raise with Quad Set  - 1 x daily - 7 x weekly - 2-3 sets - 15 reps - Sidelying Hip Abduction  - 1 x daily - 7 x weekly - 2-3 sets - 10 reps - Supine ITB Stretch with Strap (Mirrored)  - 1 x daily - 7 x weekly - 2-3 reps - 30 hold - Gastroc Stretch on Wall  - 1 x daily - 7 x weekly - 2 reps - 30 sec hold - Sit to Stand Without Arm Support  - 1 x daily - 7 x weekly - 3 sets - 10 reps - Side Stepping with Resistance at Ankles and Counter Support  - 1 x daily - 7 x weekly - 3 sets - yellow band hold - Standing Hip Abduction with Resistance at Ankles and Counter Support  - 1 x daily - 7 x weekly - 3 sets - 10 reps - yellow band hold  ASSESSMENT:  CLINICAL IMPRESSION: Pt was able to complete prescribed exercises today with no adverse effect. Over the course of PT she has progress her LE strength and functional mobility. She notes overall  decrease in L knee pain, had been feeling a lot better but she had a lot of pain in it yesterday. She should continue to improve with HEP compliance and is being discharged at this time.   Evaluation: Patient is a 48 y.o. F who was seen today for physical therapy evaluation and treatment for acute L knee pain. Physical findings are consistent with referring provider impression as pt demonstrates decreased knee ROM and strength  as well as pain in L ITB. LEFS scores shows severe disability in performance of home ADLs and higher level activities. She would benefit from skilled PT services working on improving L LE strength, L knee ROM, and manual therapy for decreasing L ITB pain.    OBJECTIVE IMPAIRMENTS: Abnormal gait, decreased activity tolerance, decreased endurance, decreased mobility, difficulty walking, decreased ROM, decreased strength, and pain  ACTIVITY LIMITATIONS: carrying, lifting, sitting, standing, squatting, stairs, transfers, and locomotion level  PARTICIPATION LIMITATIONS: driving, shopping, community activity, occupation, and yard work  PERSONAL FACTORS: Time since onset of injury/illness/exacerbation are also affecting patient's functional outcome.   REHAB POTENTIAL: Excellent  CLINICAL DECISION MAKING: Stable/uncomplicated  EVALUATION COMPLEXITY: Low   GOALS: Goals reviewed with patient? No  SHORT TERM GOALS: Target date: 06/17/2024   Pt will be compliant and knowledgeable with initial HEP for improved comfort and carryover Baseline: initial HEP given  Goal status: MET  2.  Pt will self report left knee pain no greater than 6/10 for improved comfort and functional ability Baseline: 9/10 at worst 06/26/2024: 8/10 at worst Goal status: NOT MET   LONG TERM GOALS: Target date: 07/22/2024   Pt will improve LEFS to no less than 50/80 as proxy for functional improvement with home ADLs and higher level community activity Baseline: 39/80 06/26/2024: 66/80 Goal status: MET    2.  Pt will self report left knee pain no greater than 3/10 for improved comfort and functional ability Baseline: 9/10 at worst 06/26/2024: 8/10 at worst Goal status: NOT MET   3.  Pt will increase 30 Second Sit to Stand rep count to no less than 10 reps for improved balance, strength, and functional mobility Baseline: 8 reps  06/26/2024: 9 reps Goal status: PARTIALLY MET   4.  Pt will improve L LE MMT to at least 4/5 for all tested motions for improved comfort and functional mobility Baseline: see MMT chart Goal status: MET     PLAN:  PT FREQUENCY: 1-2x/week  PT DURATION: 8 weeks  PLANNED INTERVENTIONS: 97164- PT Re-evaluation, 97110-Therapeutic exercises, 97530- Therapeutic activity, [id number]- Neuromuscular re-education, 97535- Self Care, 02859- Manual therapy, [id number]- Gait training, H9716- Electrical stimulation (unattended), [id number]- Electrical stimulation (manual), 20560 (1-2 muscles), 20561 (3+ muscles)- Dry Needling, Cryotherapy, and Moist heat  PLAN FOR NEXT SESSION: assess HEP response, strengthening, ITB stretching, manual    Alm JAYSON Kingdom PT  06/26/24 9:25 AM

## 2024-08-02 ENCOUNTER — Ambulatory Visit: Admitting: Orthopedic Surgery

## 2024-08-02 DIAGNOSIS — M25562 Pain in left knee: Secondary | ICD-10-CM | POA: Diagnosis not present

## 2024-08-02 DIAGNOSIS — G8929 Other chronic pain: Secondary | ICD-10-CM | POA: Diagnosis not present

## 2024-08-02 DIAGNOSIS — M65962 Unspecified synovitis and tenosynovitis, left lower leg: Secondary | ICD-10-CM

## 2024-08-04 ENCOUNTER — Encounter: Payer: Self-pay | Admitting: Orthopedic Surgery

## 2024-08-04 MED ORDER — BUPIVACAINE HCL 0.25 % IJ SOLN
4.0000 mL | INTRAMUSCULAR | Status: AC | PRN
Start: 2024-08-02 — End: 2024-08-02
  Administered 2024-08-02: 4 mL via INTRA_ARTICULAR

## 2024-08-04 MED ORDER — LIDOCAINE HCL 1 % IJ SOLN
5.0000 mL | INTRAMUSCULAR | Status: AC | PRN
Start: 2024-08-02 — End: 2024-08-02
  Administered 2024-08-02: 5 mL

## 2024-08-04 MED ORDER — TRIAMCINOLONE ACETONIDE 40 MG/ML IJ SUSP
40.0000 mg | INTRAMUSCULAR | Status: AC | PRN
Start: 2024-08-02 — End: 2024-08-02
  Administered 2024-08-02: 40 mg via INTRA_ARTICULAR

## 2024-08-04 NOTE — Progress Notes (Signed)
 Office Visit Note   Patient: [name]           Date of Birth: Mar 28, 1976           MRN: [id number] Visit Date: 08/02/2024 Requested by: Ottie Dorthea KIDD, PA-C [address] [place],  [place] [address] PCP: Ottie Dorthea KIDD DEVONNA  Subjective: Chief Complaint  Patient presents with   Left Knee - Pain    HPI: [name] is a 48 y.o. female who presents to the office reporting left knee pain.  Radiographs done in May of this year demonstrates only mild medial joint space narrowing in the patellofemoral joint with mild narrowing in the medial compartment but no bone-on-bone changes.  She reports continued pain.  The pain comes and goes.  Worse with prolonged activity.  She uses a brace at work which helps.  That brace is primarily in the sleeve.  She has also had physical therapy which has helped some as her legs gotten stronger.  After she gets up from a seated position she has to hop and then she was able to walk normally.  Pain is a much bigger problem than mechanical symptoms.  She works as a Social Research Officer, [employer].  She really wants to be out of pain.  The pain is primarily anterior.  Does have pain going down steps.  She is also doing a home exercise program..                ROS: All systems reviewed are negative as they relate to the chief complaint within the history of present illness.  Patient denies fevers or chills.  Assessment & Plan: Visit Diagnoses:  1. Chronic pain of left knee     Plan: Impression is left knee pain with effusion and no ligament instability.  I think she may have meniscal pathology versus occult arthritis.  The plain radiographs are pretty underwhelming in terms of significant arthritis.  Plan at this time is aspiration and injection of that left knee and MRI scan of the left knee to evaluate meniscal tear versus occult arthritis.  Follow-up after that study.  Follow-Up Instructions: No follow-ups on file.   Orders:  Orders Placed This Encounter   Procedures   MR Knee Left w/o contrast   No orders of the defined types were placed in this encounter.     Procedures: Large Joint Inj: L knee on 08/02/2024 7:53 AM Indications: diagnostic evaluation, joint swelling and pain Details: 18 G 1.5 in needle, superolateral approach  Arthrogram: No  Medications: 5 mL lidocaine  1 %; 4 mL bupivacaine 0.25 %; 40 mg triamcinolone  acetonide 40 MG/ML Outcome: tolerated well, no immediate complications Procedure, treatment alternatives, risks and benefits explained, specific risks discussed. Consent was given by the patient. Immediately prior to procedure a time out was called to verify the correct patient, procedure, equipment, support staff and site/side marked as required. Patient was prepped and draped in the usual sterile fashion.       Clinical Data: No additional findings.  Objective: Vital Signs: There were no vitals taken for this visit.  Physical Exam:  Constitutional: Patient appears well-developed HEENT:  Head: Normocephalic Eyes:EOM are normal Neck: Normal range of motion Cardiovascular: Normal rate Pulmonary/chest: Effort normal Neurologic: Patient is alert Skin: Skin is warm Psychiatric: Patient has normal mood and affect  Ortho Exam: Ortho exam demonstrates normal gait and alignment.  Left knee has full range of motion but mild effusion in the left knee compared to no  effusion in the right knee.  Collateral and cruciate ligaments are stable.  Extensor mechanism intact.  No nerve root tension signs bilaterally.  No groin pain with internal or external rotation of that left or right hip.  Specialty Comments:  No specialty comments available.  Imaging: No results found.   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

## 2024-08-08 ENCOUNTER — Encounter: Payer: Self-pay | Admitting: Orthopedic Surgery

## 2024-08-23 ENCOUNTER — Ambulatory Visit
Admission: RE | Admit: 2024-08-23 | Discharge: 2024-08-23 | Disposition: A | Discharge status: Home / Self Care | Source: Ambulatory Visit | Attending: Orthopedic Surgery | Admitting: Orthopedic Surgery

## 2024-08-23 DIAGNOSIS — G8929 Other chronic pain: Secondary | ICD-10-CM

## 2024-08-28 ENCOUNTER — Ambulatory Visit: Payer: Self-pay | Admitting: Orthopedic Surgery

## 2024-08-28 NOTE — Progress Notes (Signed)
 Pls have her f/u sometime next week with me or luke thx

## 2024-09-04 NOTE — Telephone Encounter (Signed)
-----   Message from [facility] Terri K sent at 09/04/2024  3:53 PM EST ----- I am sorry to leave this with you. I had called the patient last Wednesday, trying to get her in this week. No return call from the patient. She does have an appt later in the month with Dr. Addie,  though. ----- Message ----- From: Addie Cordella Hamilton, MD Sent: 08/28/2024  12:03 PM EST To: Veva CHRISTELLA Novak, CMA  Pls have her f/u sometime next week with me or luke thx ----- Message ----- From: Interface, Rad Results In Sent: 08/28/2024  11:12 AM EST To: Cordella Hamilton Addie, MD

## 2024-09-04 NOTE — Telephone Encounter (Signed)
 Attempt made to call patient about getting follow up appt scheduled. No answer LMVM for her to call our office to schedule

## 2024-09-30 ENCOUNTER — Ambulatory Visit: Admitting: Orthopedic Surgery

## 2024-09-30 DIAGNOSIS — S83242D Other tear of medial meniscus, current injury, left knee, subsequent encounter: Secondary | ICD-10-CM | POA: Diagnosis not present

## 2024-10-01 ENCOUNTER — Encounter: Payer: Self-pay | Admitting: Orthopedic Surgery

## 2024-10-01 NOTE — Progress Notes (Signed)
 "  Office Visit Note   Patient: [name]           Date of Birth: December 21, 1975           MRN: [id number] Visit Date: 09/30/2024 Requested by: Amanda Dorthea KIDD, PA-C [address] [place],  [place] [address] PCP: [name] [name]  Subjective: Chief Complaint  Patient presents with   Left Knee - Follow-up    Review MRI    HPI: [name] is a 48 y.o. female who presents to the office reporting left knee pain.  Since she was last seen she has had an MRI scan of the left knee which is reviewed.  She had an injection 08/02/2024 which has given her good relief.  Currently not having symptoms.  No issues with the knee at this time.  MRI scan is reviewed and it does show a medial meniscal tear involving about half the meniscal volume with a flap of meniscal tissue underneath the tibial plateau.  Minimal degenerative changes present in that medial compartment.  She works as a conservation officer, nature.  Taking over-the-counter medication..                ROS: All systems reviewed are negative as they relate to the chief complaint within the history of present illness.  Patient denies fevers or chills.  Assessment & Plan: Visit Diagnoses: No diagnosis found.  Plan: Impression is left knee medial meniscal tear which is currently asymptomatic after an injection plan is observation for now.  I think it is likely that she may come to surgical intervention for this but currently she is asymptomatic and we should check her back in 3 months for repeat check on symptoms and to see if she has an effusion.  Follow-Up Instructions: No follow-ups on file.   Orders:  No orders of the defined types were placed in this encounter.  No orders of the defined types were placed in this encounter.     Procedures: No procedures performed   Clinical Data: No additional findings.  Objective: Vital Signs: There were no vitals taken for this visit.  Physical Exam:  Constitutional: Patient appears  well-developed HEENT:  Head: Normocephalic Eyes:EOM are normal Neck: Normal range of motion Cardiovascular: Normal rate Pulmonary/chest: Effort normal Neurologic: Patient is alert Skin: Skin is warm Psychiatric: Patient has normal mood and affect  Ortho Exam: Ortho exam demonstrates full range of motion with no effusion in the knee.  No real joint line tenderness with negative McMurray compression testing today.  Extensor mechanism intact.  Collateral cruciate ligaments are stable.  No groin pain with internal/external rotation of the leg.  Specialty Comments:  No specialty comments available.  Imaging: No results found.   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

## 2024-11-04 ENCOUNTER — Ambulatory Visit: Admitting: Orthopedic Surgery

## 2024-12-30 ENCOUNTER — Ambulatory Visit: Admitting: Orthopedic Surgery
